# Patient Record
Sex: Female | Born: 1990 | Race: Black or African American | Hispanic: No | Marital: Single | Smoking: Current every day smoker
Health system: Southern US, Community
[De-identification: ages and names within clinical notes are randomized; demographics above are authoritative.]

## PROBLEM LIST (undated history)

## (undated) DIAGNOSIS — J45909 Unspecified asthma, uncomplicated: Secondary | ICD-10-CM

## (undated) DIAGNOSIS — Z9189 Other specified personal risk factors, not elsewhere classified: Secondary | ICD-10-CM

## (undated) DIAGNOSIS — F419 Anxiety disorder, unspecified: Secondary | ICD-10-CM

## (undated) DIAGNOSIS — D649 Anemia, unspecified: Secondary | ICD-10-CM

## (undated) DIAGNOSIS — F32A Depression, unspecified: Secondary | ICD-10-CM

## (undated) DIAGNOSIS — R011 Cardiac murmur, unspecified: Secondary | ICD-10-CM

## (undated) HISTORY — DX: Depression, unspecified: F32.A

## (undated) HISTORY — DX: Cardiac murmur, unspecified: R01.1

## (undated) HISTORY — PX: APPENDECTOMY: SHX54

## (undated) HISTORY — DX: Anxiety disorder, unspecified: F41.9

## (undated) HISTORY — DX: Other specified personal risk factors, not elsewhere classified: Z91.89

## (undated) HISTORY — DX: Unspecified asthma, uncomplicated: J45.909

## (undated) HISTORY — PX: COLON SURGERY: SHX602

---

## 2011-12-07 ENCOUNTER — Ambulatory Visit: Payer: Self-pay | Admitting: Cardiovascular Disease

## 2013-04-16 ENCOUNTER — Emergency Department: Payer: Self-pay | Admitting: Emergency Medicine

## 2013-04-16 LAB — COMPREHENSIVE METABOLIC PANEL
BUN: 8 mg/dL (ref 7–18)
Bilirubin,Total: 0.3 mg/dL (ref 0.2–1.0)
Chloride: 105 mmol/L (ref 98–107)
Creatinine: 0.82 mg/dL (ref 0.60–1.30)
EGFR (African American): >60
EGFR (Non-African Amer.): >60
Glucose: 86 mg/dL (ref 65–99)
Osmolality: 270 (ref 275–301)
Potassium: 3.6 mmol/L (ref 3.5–5.1)
Sodium: 136 mmol/L (ref 136–145)
Total Protein: 8.5 g/dL — ABNORMAL HIGH (ref 6.4–8.2)

## 2013-04-16 LAB — CBC WITH DIFFERENTIAL/PLATELET
Basophil %: 0.5 %
Eosinophil #: 0.1 10*3/uL (ref 0.0–0.7)
Eosinophil %: 1.3 %
Lymphocyte %: 18.3 %
MCH: 29 pg (ref 26.0–34.0)
MCHC: 32.4 g/dL (ref 32.0–36.0)
Monocyte #: 0.7 x10 3/mm (ref 0.2–0.9)
Monocyte %: 7.7 %
Neutrophil #: 6.5 10*3/uL (ref 1.4–6.5)
RDW: 12.7 % (ref 11.5–14.5)
WBC: 9 10*3/uL (ref 3.6–11.0)

## 2013-04-16 LAB — URINALYSIS, COMPLETE
Bilirubin,UR: NEGATIVE
Glucose,UR: NEGATIVE mg/dL (ref 0–75)
Nitrite: NEGATIVE
Ph: 5 (ref 4.5–8.0)
Protein: 100
Specific Gravity: 1.029 (ref 1.003–1.030)
WBC UR: 47 /HPF (ref 0–5)

## 2013-04-16 LAB — PREGNANCY, URINE: Pregnancy Test, Urine: NEGATIVE m[IU]/mL

## 2013-04-21 ENCOUNTER — Emergency Department: Payer: Self-pay | Admitting: Emergency Medicine

## 2013-04-21 LAB — URINALYSIS, COMPLETE
Bacteria: NONE SEEN
Bilirubin,UR: NEGATIVE
Glucose,UR: NEGATIVE mg/dL (ref 0–75)
Ketone: NEGATIVE
Leukocyte Esterase: NEGATIVE
Nitrite: NEGATIVE
Squamous Epithelial: 1

## 2013-04-21 LAB — CBC
HGB: 12.1 g/dL (ref 12.0–16.0)
MCV: 90 fL (ref 80–100)
Platelet: 352 10*3/uL (ref 150–440)
RBC: 4.15 10*6/uL (ref 3.80–5.20)
RDW: 12.7 % (ref 11.5–14.5)
WBC: 9.2 10*3/uL (ref 3.6–11.0)

## 2013-04-21 LAB — BASIC METABOLIC PANEL
Anion Gap: 5 — ABNORMAL LOW (ref 7–16)
BUN: 6 mg/dL — ABNORMAL LOW (ref 7–18)
Calcium, Total: 8.9 mg/dL (ref 8.5–10.1)
Chloride: 103 mmol/L (ref 98–107)
Co2: 29 mmol/L (ref 21–32)
EGFR (African American): >60
Glucose: 90 mg/dL (ref 65–99)
Sodium: 137 mmol/L (ref 136–145)

## 2013-04-21 LAB — PREGNANCY, URINE: Pregnancy Test, Urine: NEGATIVE m[IU]/mL

## 2013-04-21 LAB — WET PREP, GENITAL

## 2013-04-24 ENCOUNTER — Ambulatory Visit: Payer: Self-pay | Admitting: Unknown Physician Specialty

## 2013-04-24 ENCOUNTER — Inpatient Hospital Stay: Payer: Self-pay | Admitting: Surgery

## 2013-04-24 LAB — CBC WITH DIFFERENTIAL/PLATELET
Basophil #: 0.1 10*3/uL (ref 0.0–0.1)
Basophil %: 0.5 %
HCT: 37.9 % (ref 35.0–47.0)
HGB: 12.6 g/dL (ref 12.0–16.0)
Lymphocyte #: 1.8 10*3/uL (ref 1.0–3.6)
Lymphocyte %: 14 %
MCH: 29.5 pg (ref 26.0–34.0)
MCHC: 33.2 g/dL (ref 32.0–36.0)
Monocyte #: 0.5 x10 3/mm (ref 0.2–0.9)
Neutrophil #: 10.2 10*3/uL — ABNORMAL HIGH (ref 1.4–6.5)
Neutrophil %: 79.8 %
Platelet: 411 10*3/uL (ref 150–440)
RBC: 4.26 10*6/uL (ref 3.80–5.20)
RDW: 12.7 % (ref 11.5–14.5)

## 2013-04-24 LAB — COMPREHENSIVE METABOLIC PANEL
Albumin: 3.6 g/dL (ref 3.4–5.0)
Alkaline Phosphatase: 66 U/L
Anion Gap: 3 — ABNORMAL LOW (ref 7–16)
BUN: 6 mg/dL — ABNORMAL LOW (ref 7–18)
Bilirubin,Total: 0.4 mg/dL (ref 0.2–1.0)
Calcium, Total: 9.9 mg/dL (ref 8.5–10.1)
Chloride: 97 mmol/L — ABNORMAL LOW (ref 98–107)
Co2: 30 mmol/L (ref 21–32)
Creatinine: 0.65 mg/dL (ref 0.60–1.30)
EGFR (African American): >60
EGFR (Non-African Amer.): >60
Osmolality: 257 (ref 275–301)
Potassium: 3.5 mmol/L (ref 3.5–5.1)
SGOT(AST): 22 U/L (ref 15–37)
Total Protein: 9 g/dL — ABNORMAL HIGH (ref 6.4–8.2)

## 2013-04-24 LAB — URINALYSIS, COMPLETE
Bacteria: NONE SEEN
Bilirubin,UR: NEGATIVE
Glucose,UR: NEGATIVE mg/dL (ref 0–75)
Leukocyte Esterase: NEGATIVE
Nitrite: NEGATIVE
Ph: 7 (ref 4.5–8.0)
Specific Gravity: 1.056 (ref 1.003–1.030)

## 2013-04-24 LAB — HCG, QUANTITATIVE, PREGNANCY: Beta Hcg, Quant.: <1 m[IU]/mL — ABNORMAL LOW

## 2013-04-24 LAB — LIPASE, BLOOD: Lipase: 239 U/L (ref 73–393)

## 2013-04-25 LAB — COMPREHENSIVE METABOLIC PANEL
Alkaline Phosphatase: 53 U/L
Anion Gap: 5 — ABNORMAL LOW (ref 7–16)
BUN: 6 mg/dL — ABNORMAL LOW (ref 7–18)
Calcium, Total: 9.1 mg/dL (ref 8.5–10.1)
Chloride: 98 mmol/L (ref 98–107)
Creatinine: 0.71 mg/dL (ref 0.60–1.30)
EGFR (African American): >60
EGFR (Non-African Amer.): >60
Glucose: 86 mg/dL (ref 65–99)
Potassium: 3.7 mmol/L (ref 3.5–5.1)
SGPT (ALT): 11 U/L — ABNORMAL LOW (ref 12–78)

## 2013-04-25 LAB — CBC WITH DIFFERENTIAL/PLATELET
Basophil #: 0 10*3/uL (ref 0.0–0.1)
Basophil %: 0.4 %
Eosinophil %: 1.1 %
HGB: 10.8 g/dL — ABNORMAL LOW (ref 12.0–16.0)
Lymphocyte #: 1.7 10*3/uL (ref 1.0–3.6)
Lymphocyte %: 16.5 %
MCH: 29.4 pg (ref 26.0–34.0)
MCHC: 33.1 g/dL (ref 32.0–36.0)
Monocyte #: 0.7 x10 3/mm (ref 0.2–0.9)
Monocyte %: 6.5 %
Neutrophil %: 75.5 %
Platelet: 340 10*3/uL (ref 150–440)
RBC: 3.69 10*6/uL — ABNORMAL LOW (ref 3.80–5.20)
WBC: 10.2 10*3/uL (ref 3.6–11.0)

## 2013-04-26 LAB — BASIC METABOLIC PANEL
Anion Gap: 7 (ref 7–16)
BUN: 5 mg/dL — ABNORMAL LOW (ref 7–18)
Calcium, Total: 8.7 mg/dL (ref 8.5–10.1)
Chloride: 100 mmol/L (ref 98–107)
Co2: 29 mmol/L (ref 21–32)
Creatinine: 0.76 mg/dL (ref 0.60–1.30)
EGFR (African American): >60
EGFR (Non-African Amer.): >60
Glucose: 84 mg/dL (ref 65–99)
Osmolality: 268 (ref 275–301)
Potassium: 3.9 mmol/L (ref 3.5–5.1)
Sodium: 136 mmol/L (ref 136–145)

## 2013-04-26 LAB — CBC WITH DIFFERENTIAL/PLATELET
Basophil #: 0 10*3/uL (ref 0.0–0.1)
Basophil %: 0.5 %
Eosinophil #: 0.2 10*3/uL (ref 0.0–0.7)
Eosinophil %: 1.8 %
HCT: 31 % — ABNORMAL LOW (ref 35.0–47.0)
HGB: 10.2 g/dL — ABNORMAL LOW (ref 12.0–16.0)
Lymphocyte #: 1.3 10*3/uL (ref 1.0–3.6)
Lymphocyte %: 14.9 %
MCH: 28.9 pg (ref 26.0–34.0)
MCHC: 32.9 g/dL (ref 32.0–36.0)
MCV: 88 fL (ref 80–100)
Monocyte #: 0.6 x10 3/mm (ref 0.2–0.9)
Monocyte %: 7.3 %
Neutrophil #: 6.7 10*3/uL — ABNORMAL HIGH (ref 1.4–6.5)
Neutrophil %: 75.5 %
Platelet: 336 10*3/uL (ref 150–440)
RBC: 3.52 10*6/uL — ABNORMAL LOW (ref 3.80–5.20)
RDW: 12.6 % (ref 11.5–14.5)
WBC: 8.9 10*3/uL (ref 3.6–11.0)

## 2013-04-30 LAB — PATHOLOGY REPORT

## 2013-05-01 LAB — CBC WITH DIFFERENTIAL/PLATELET
BASOS PCT: 0.9 %
Basophil #: 0.1 10*3/uL (ref 0.0–0.1)
EOS ABS: 0.3 10*3/uL (ref 0.0–0.7)
Eosinophil %: 3.2 %
HCT: 26.7 % — ABNORMAL LOW (ref 35.0–47.0)
HGB: 8.9 g/dL — ABNORMAL LOW (ref 12.0–16.0)
LYMPHS ABS: 1.8 10*3/uL (ref 1.0–3.6)
Lymphocyte %: 20.2 %
MCH: 29.3 pg (ref 26.0–34.0)
MCHC: 33.3 g/dL (ref 32.0–36.0)
MCV: 88 fL (ref 80–100)
MONO ABS: 0.7 x10 3/mm (ref 0.2–0.9)
MONOS PCT: 7.6 %
NEUTROS PCT: 68.1 %
Neutrophil #: 6.2 10*3/uL (ref 1.4–6.5)
Platelet: 330 10*3/uL (ref 150–440)
RBC: 3.04 10*6/uL — ABNORMAL LOW (ref 3.80–5.20)
RDW: 12.9 % (ref 11.5–14.5)
WBC: 9.1 10*3/uL (ref 3.6–11.0)

## 2014-03-04 IMAGING — US US PELV - US TRANSVAGINAL
1 series · 14 of 25 positions shown · non-contrast
Comparison: None

CLINICAL DATA: Pelvic pain

EXAM:
TRANSABDOMINAL AND TRANSVAGINAL ULTRASOUND OF PELVIS
TECHNIQUE: Study was performed transabdominally to optimize pelvic field of
view evaluation and transvaginally to optimize internal visceral
architecture evaluation.

[Series 1: us pelv - us transvaginal · 0.20mm/px · 14 of 70 slices shown]
[im 1/70]
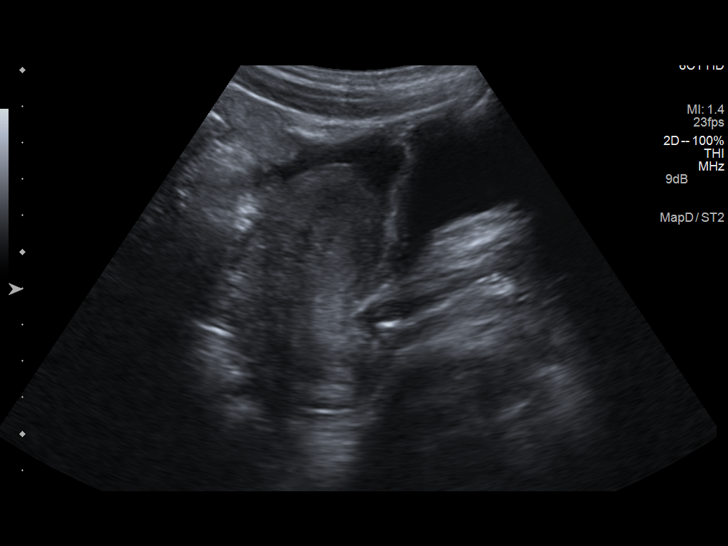
[im 6/70]
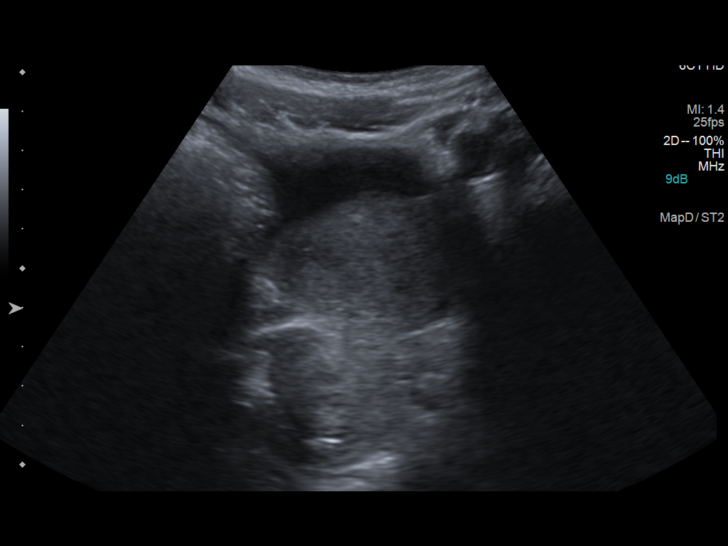
[im 12/70]
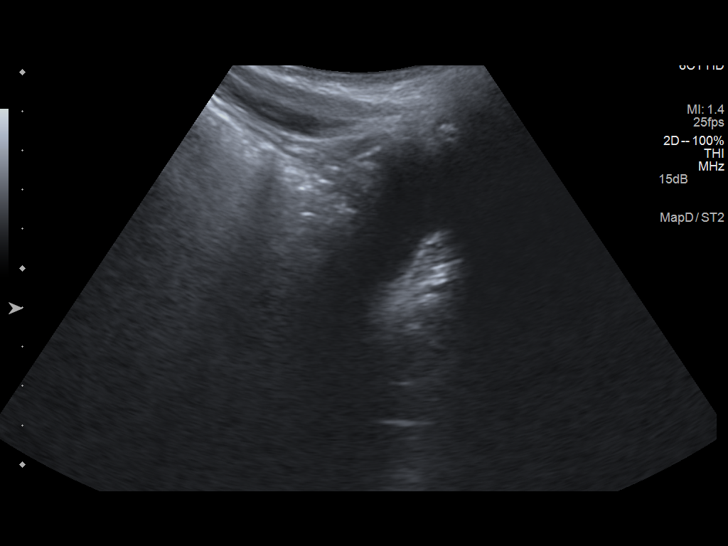
[im 18/70]
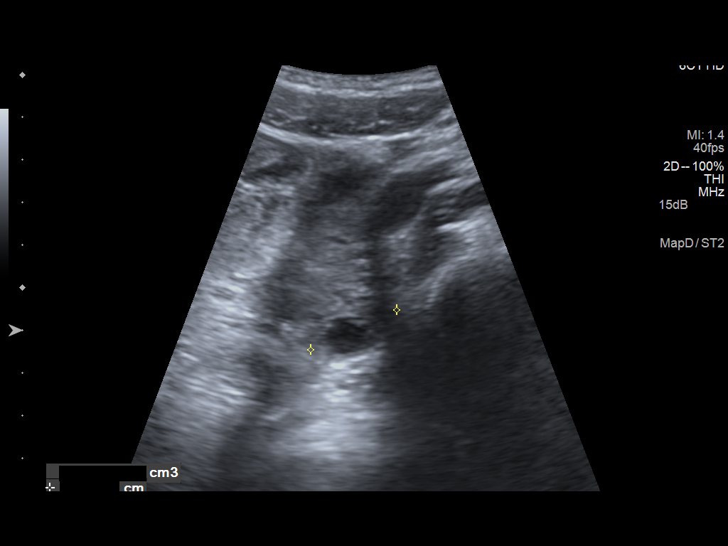
[im 24/70]
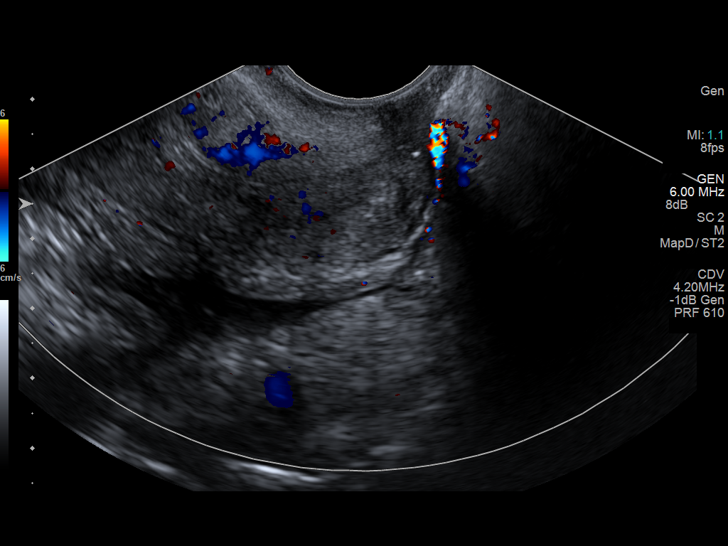
[im 26/70]
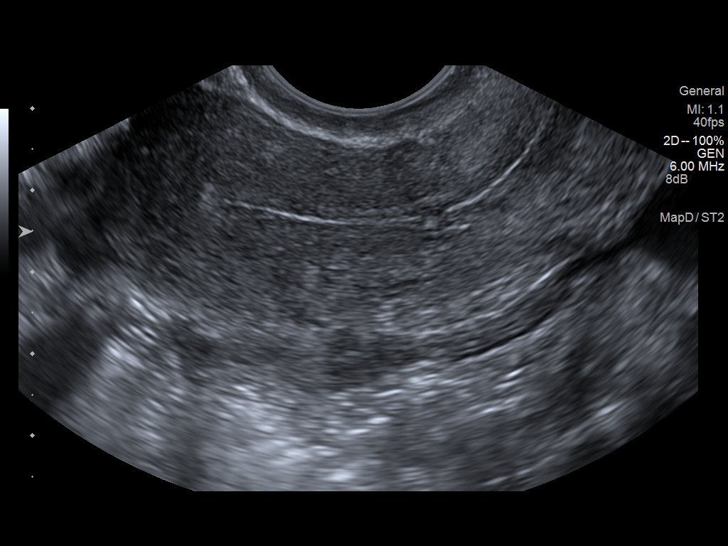
[im 32/70]
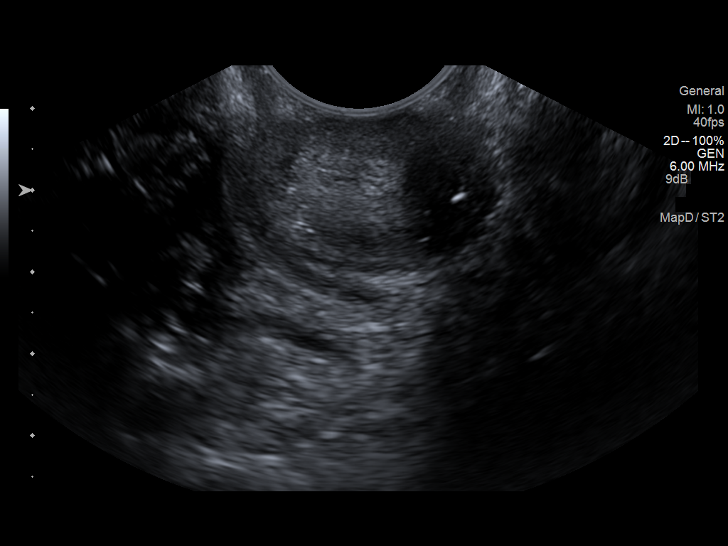
[im 38/70]
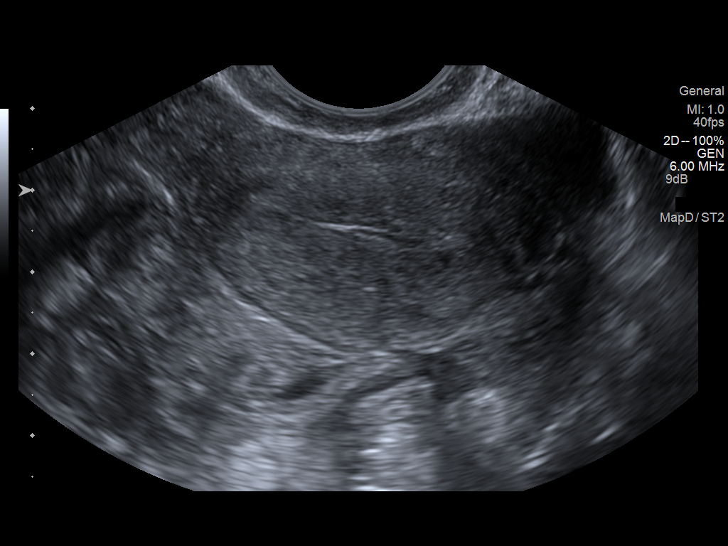
[im 44/70]
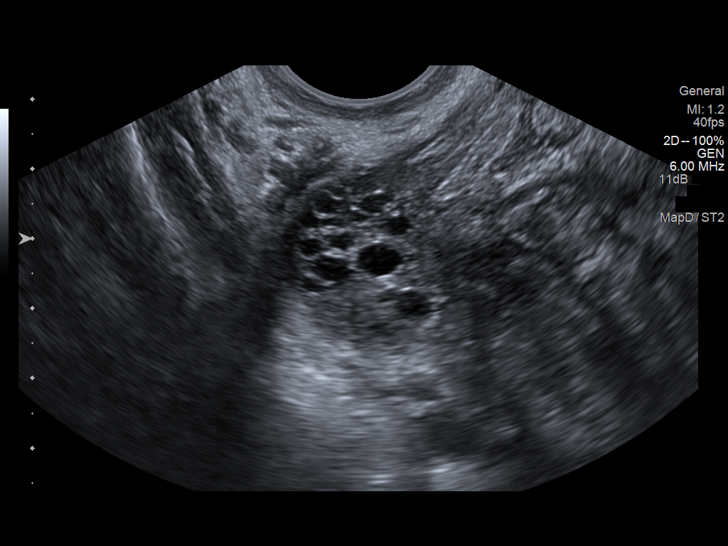
[im 47/70]
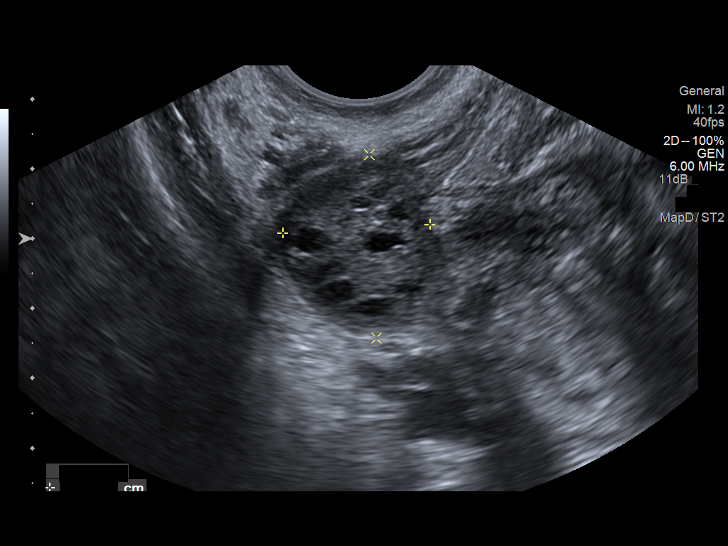
[im 52/70]
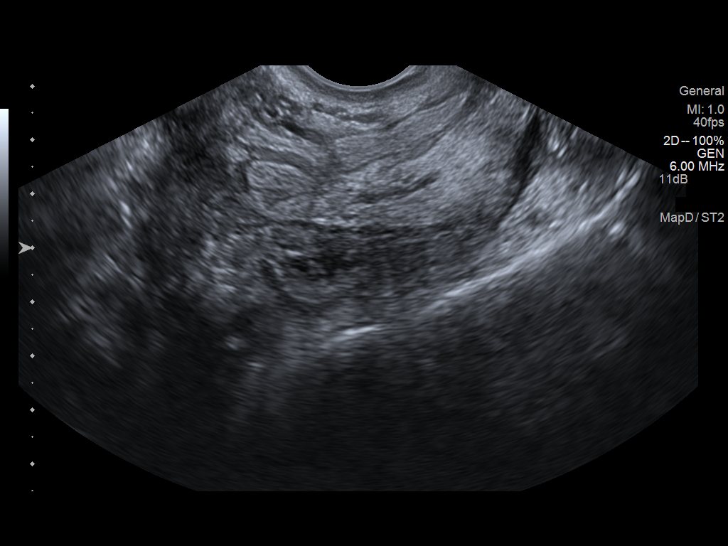
[im 58/70]
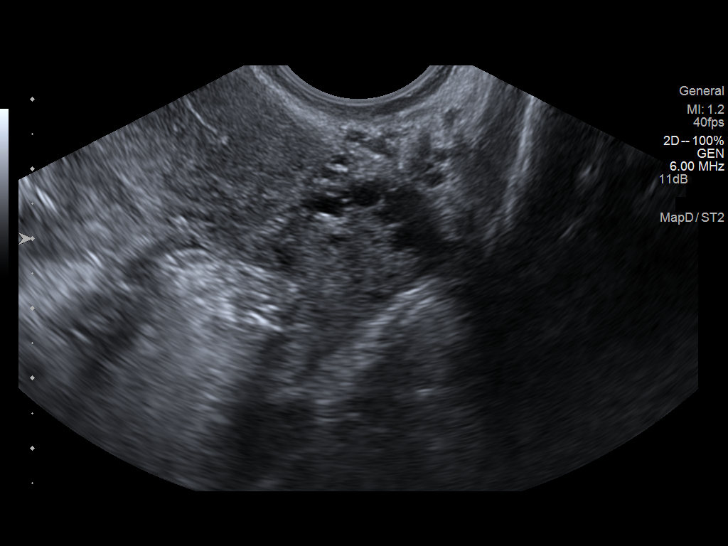
[im 64/70]
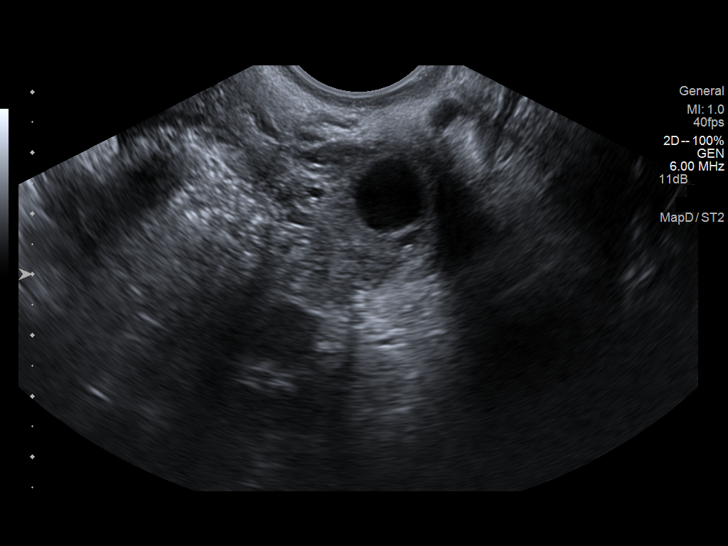
[im 70/70]
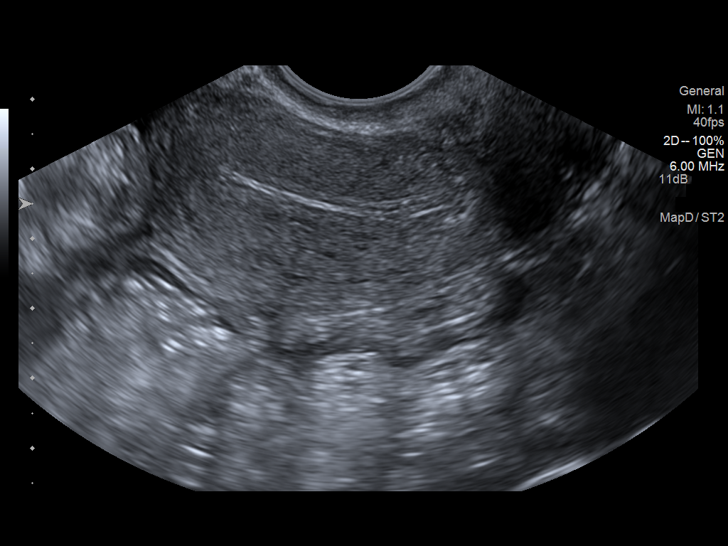

[14 of 25 positions shown; findings below may reference images not displayed]

FINDINGS: Uterus measures 7.1 x 2.4 x 4.2 cm in size. Uterus is anteverted.
There is no intrauterine mass. Endometrium measures 2 mm in
thickness with a smooth contour. There is trace fluid in the
endometrium.

Right ovary measures 2.1 x 2.6 x 2.4 cm. Left ovary measures 2.0 x
2.4 x 2.8 cm. There is no extrauterine pelvic or adnexal mass. There
is a small amount of free fluid in the cul-de-sac.
IMPRESSION: Small amount of free fluid in cul-de-sac. Question recent ovarian
cyst rupture. No ovarian mass seen.

Trace fluid in the endometrium is of questionable significance.
Uterus otherwise appears normal.

## 2014-03-09 IMAGING — CR DG ABDOMEN 2V
1 series · 2 of 2 positions shown · non-contrast
Comparison: None.

CLINICAL DATA: Abdominal pain

EXAM:
ABDOMEN - 2 VIEW

[Series 1: erect ap · 0.17mm/px · 2 of 2 slices shown]
[im 1/2]
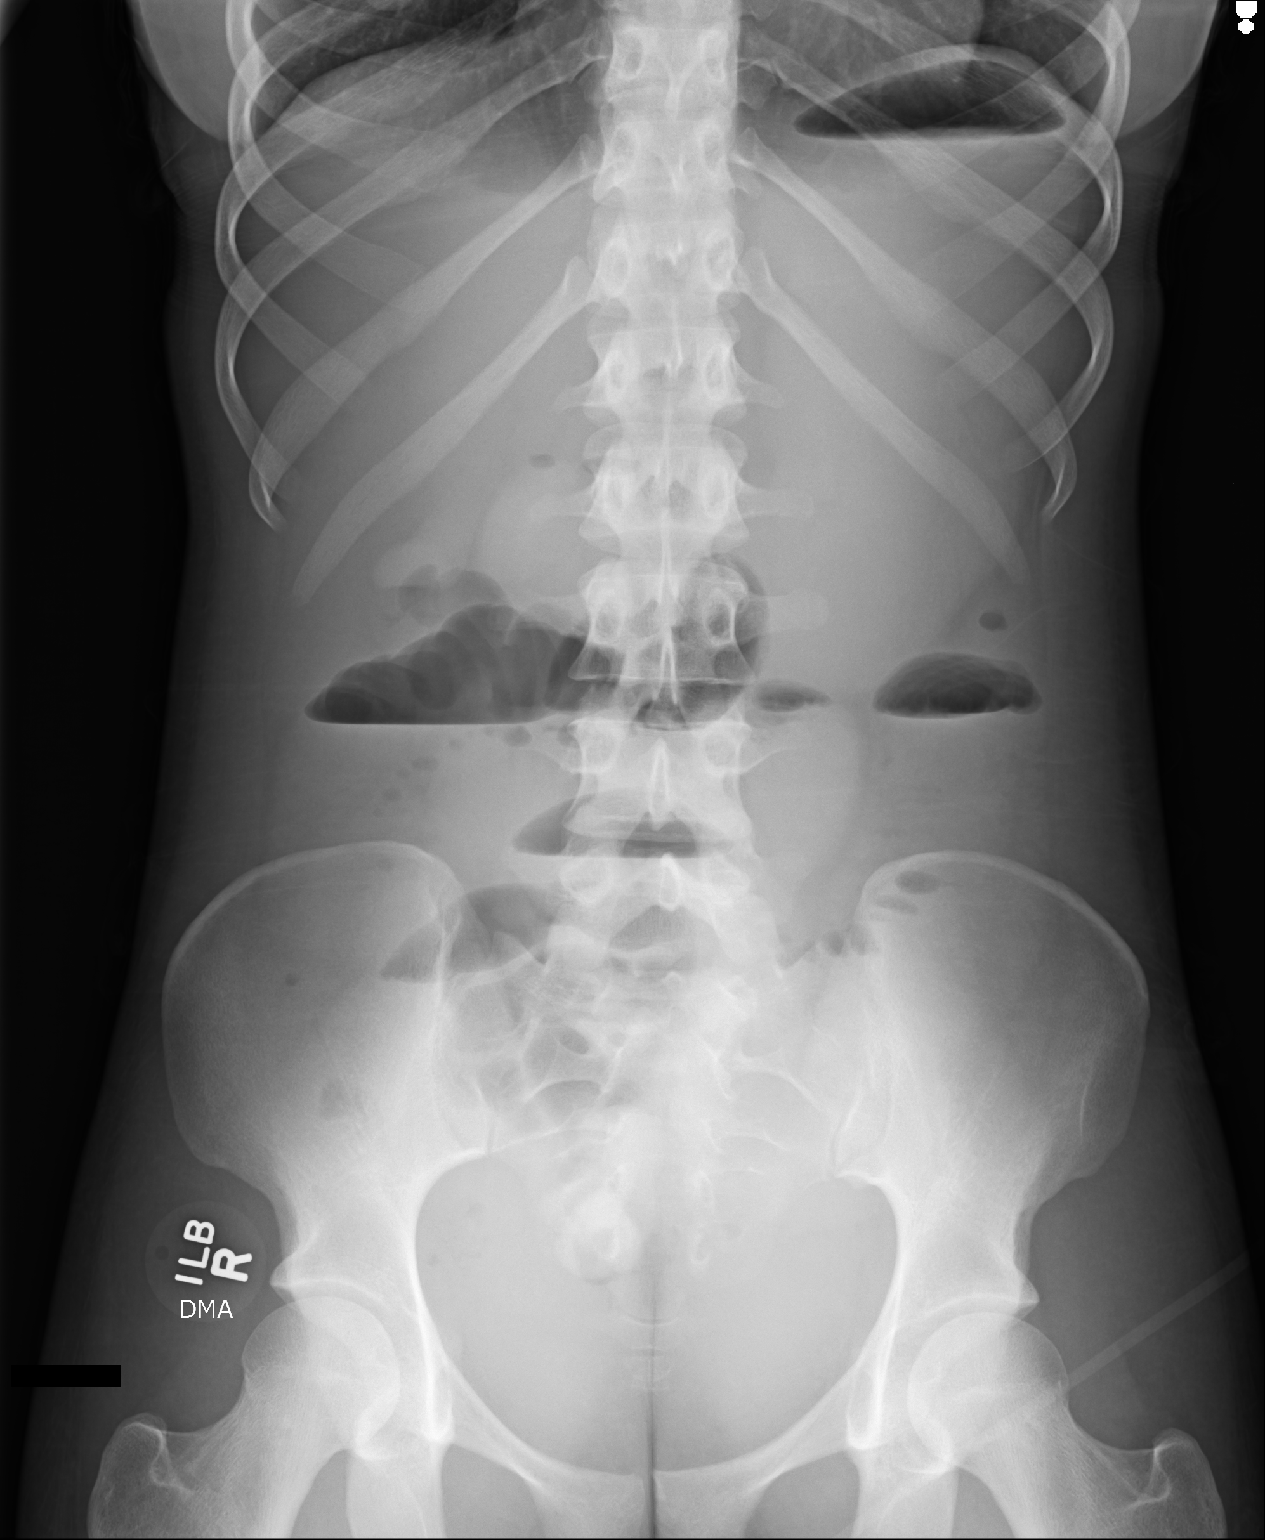
[im 2/2]
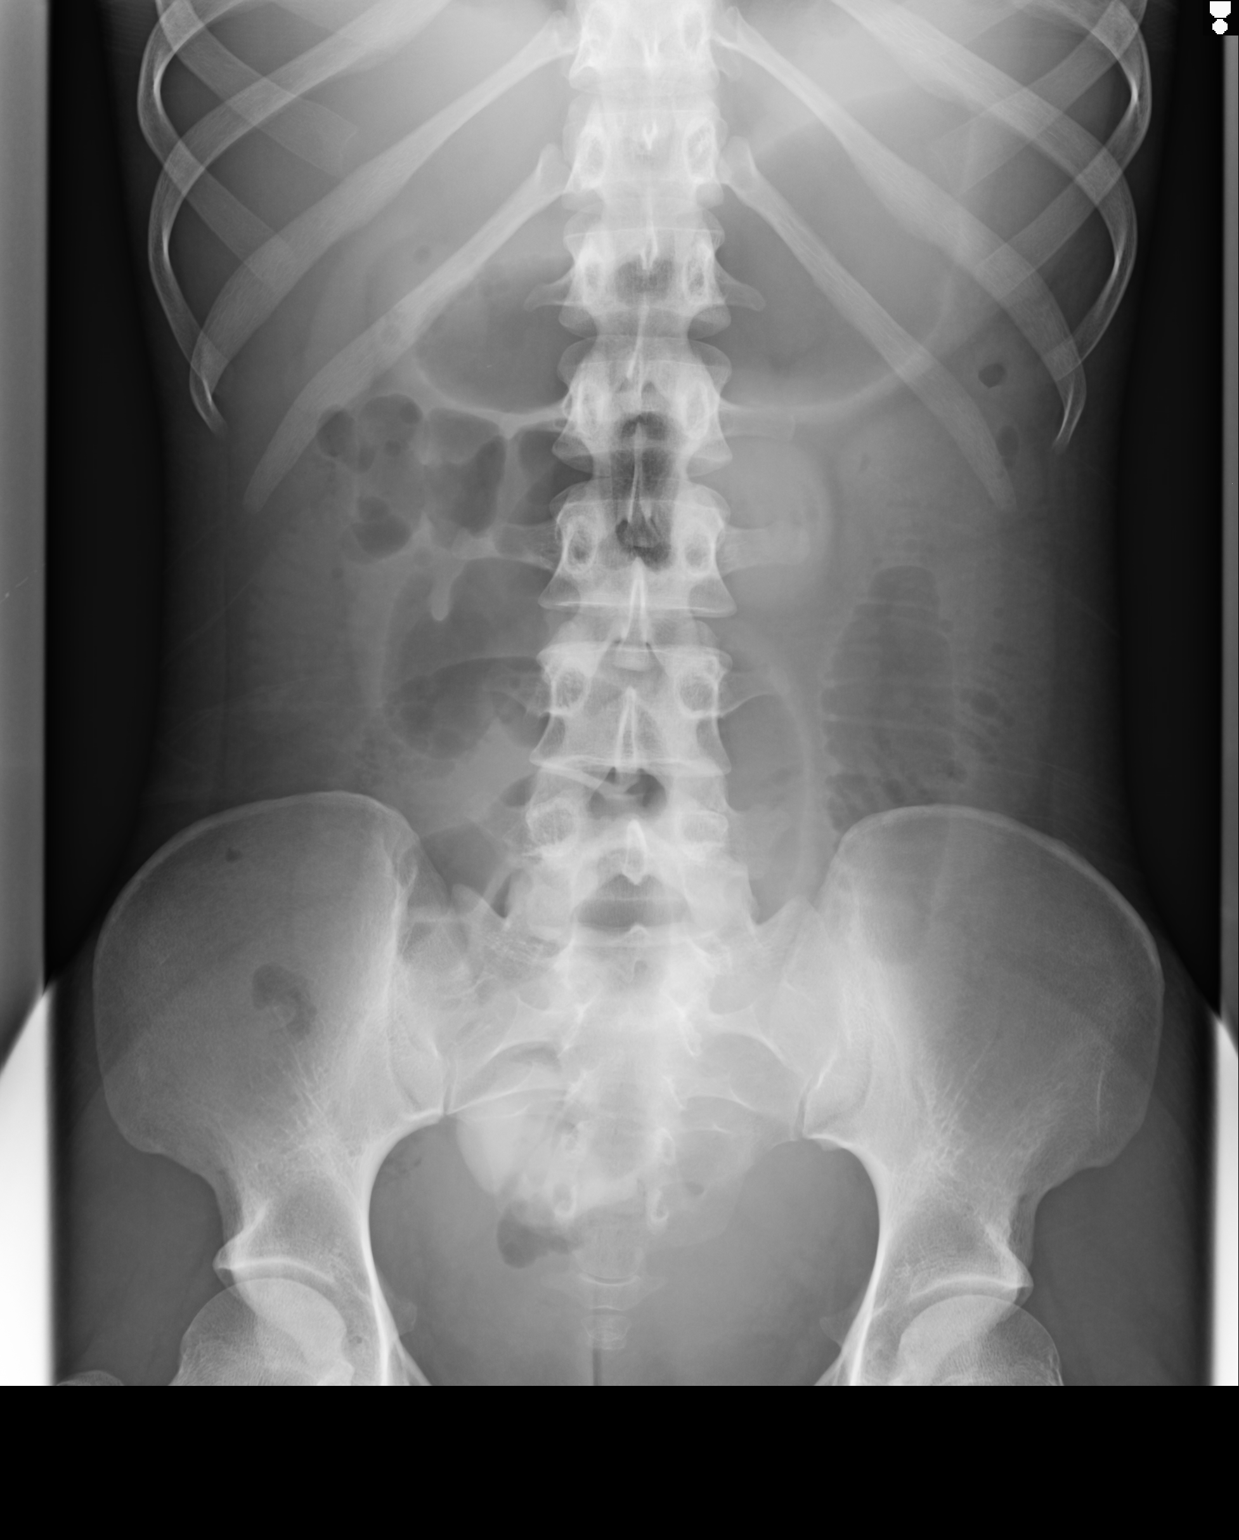

[2 of 2 positions shown; findings below may reference images not displayed]

FINDINGS: There is small bowel dilatation with multiple small bowel air-fluid
levels. There is a relative paucity of colonic air. There is no
pneumoperitoneum, pneumatosis or portal venous gas. There are no
pathologic calcifications. The osseous structures are unremarkable.
IMPRESSION: Persistent small bowel dilatation with multiple air-fluid levels
most concerning for small bowel obstruction.

## 2014-08-16 NOTE — H&P (Signed)
PATIENT NAME:  Brianna KindlerFOWLER, [name] DATE OF BIRTH:  Jan 04, 1991  DATE OF ADMISSION:  04/24/2013  REASON FOR ADMISSION: Right lower quadrant pain, leukocytosis and abdominal inflammation with possible intra-abdominal volvulus.   HISTORY OF PRESENT ILLNESS: [name] is a pleasant 24 year old female who over the last week, has been having recurrent sharp pains in her right side. She says that it began suddenly when they occur. They usually resolve a bit and they are worse with movement. She also has had some subjective chills. No nausea and vomiting and is hungry, but does have a significant feeling of being distended. She has been to urgent care multiple times this week in the ER and has been told she has colitis versus urinary tract infection versus most recent pelvic inflammatory disease as her gonococcal PCR  came back positive. No headache, fevers, chest pain, shortness of breath, cough.  She denies vomiting, diarrhea, constipation, dysuria or hematuria.   HOME MEDICATIONS: Macrodantin one cap b.i.d. which she has not been taking, OCP 1/20 p.o. daily.   ALLERGIES: No known drug allergies.   PAST MEDICAL HISTORY:  History of heart murmur and asthma.   FAMILY HISTORY: She has family history of Hodgkin's, hypertension. No history of heart disease, diabetes.   REVIEW OF SYSTEMS: A 12-point review of systems is obtained. Pertinent positive and negatives as above.    PHYSICAL EXAMINATION: VITAL SIGNS: Temperature 97.8, pulse 85, blood pressure 120/78, respirations 14.  GENERAL: No acute distress. Alert and oriented x 3.  HEAD: Normocephalic, atraumatic.  EYES: No scleral icterus. No conjunctivitis.  FACE: No obvious facial trauma. Normal external nose. Normal external ears.  ABDOMEN: Soft, mildly tender in right lower quadrant, moderate distention.  EXTREMITIES: Moves all extremities well. Strength 5/5.   NEUROLOGIC:  Cranial nerves II through XII grossly intact.   LABORATORY DATA:  Currently: White cell count 12.8 which is up from 9.2, 80% neutrophils, sodium is 130, chloride is 97, creatinine is 0.65.   CT scan shows mild pelvic fluid, prominent in the proximal and mid small bowel distention with tapering in the mid-right abdomen with what looks to me to be a twist in the mesentery and no contrast past that point.    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 We will admit for antibiotics for pelvic inflammatory disease and follow-up x-ray in the abdomen to ensure contrast has progressed to colon.  If does not progress, becomes worse or leukocytosis increases, we will discuss possible diagnostic laparoscopy in concurrence with OB/GYN and their recs.    ____________________________ Si Raiderhristopher A. Satya Bohall, MD cal:cc D: 04/24/2013 17:35:17 ET T: 04/24/2013 18:53:34 ET JOB#: [id number]  cc: Cristal Deerhristopher A. Delanna Blacketer, MD, <Dictator> Jarvis NewcomerHRISTOPHER A Coen Miyasato MD ELECTRONICALLY SIGNED 04/25/2013 16:10

## 2014-08-17 NOTE — Op Note (Signed)
PATIENT NAME:  [name], [name] MR#:  [id number] DATE OF BIRTH:  March 31, 1991  DATE OF PROCEDURE:  04/26/2013  PREOPERATIVE DIAGNOSIS:  Small bowel obstruction.  POSTOPERATIVE DIAGNOSIS:  Small bowel obstruction.  PROCEDURE: 1.  Laparoscopy.  2.  Laparoscopic appendectomy.  3.  Mini laparotomy. 4.  Lysis of adhesions.  ANESTHESIA:  General.  SURGEON:  Quentin Orealph L. Ely, III, MD  OPERATIVE PROCEDURE:  With the patient in the supine position, after the induction of appropriate general anesthesia, the patient's abdomen was prepped with ChloraPrep and draped with sterile towels. The patient was placed in the head down, feet up position. A small infraumbilical incision was made in the standard fashion and carried down bluntly through the subcutaneous tissue. A Veress needle was used to cannulate the peritoneal cavity. CO2 was insufflated to appropriate pressure measurements. When approximately 2.5 L of CO2 were instilled, the Veress needle was withdrawn, an 11-mm [employer] port was inserted into the peritoneal cavity. Intraperitoneal position was confirmed, and CO2 was reinsufflated. The right upper quadrant transverse incision and an 11-mm port inserted under direct vision. The pelvis was examined. There was a large amount of free fluid that was not frankly infected. The appendix was identified. It was thickened, injected but did not appear to have a primary appendicitis but a reactive process. Both tubes and ovaries were examined. While they were both thickened, there is no obvious purulence or salpingitis noted. The appendix was abnormal enough that I recommended removal. The mesoappendix was divided with a single application of Endo GIA stapling device carrying a white load. The base of the appendix was followed back to the cecum and amputated with a single application of the Endo GIA stapler carrying a blue load. The appendix was captured in an Endo Catch apparatus and removed. Because there was a  significant bowel size mismatch, particularly proximally and distally, I elected to perform a mini laparotomy. The midline incision was extended slightly above and slightly below the umbilicus and carried down through the subcutaneous tissue with Bovie electrocautery. The midline fascia was identified and opened the length of the skin incision as was the peritoneum. The bowel was elevated into the wound. The very large, dilated small bowel was followed distally and there was an obvious band adhesion across the mesentery into the pelvis and appeared to attach to the pelvic sidewall beside the right tube. This was lysed and the bowel was allowed to expand up out of the pelvis. The bowel was then followed to the ileocecal valve. No other abnormalities were identified. The bowel contents were returned to their anatomic positions. The midline fascia was closed with inverted figure-of-eight sutures of 0 Maxon. Then 0 Vicryl was used to close the fascia in the left lower quadrant and in the right upper quadrant. The skin was closed with 4-0 Monocryl subcuticularly. Steri-Strips applied. Dermabond was applied to the incisions and they were sterilely dressed. The patient was returned to the Recovery Room having tolerated the procedure well. Sponge and instrument counts were correct x 2 in the Operating Room.  ____________________________ Quentin Orealph L. Ely III, MD rle:jm D: 04/26/2013 12:49:19 ET T: 04/26/2013 13:22:17 ET JOB#: [id number]  cc: Quentin Orealph L. Ely III, MD, <Dictator> Jacques EarthlyAnika S. Valentino Saxonherry, MD Quentin OreALPH L ELY MD ELECTRONICALLY SIGNED 04/26/2013 16:20

## 2014-08-17 NOTE — Discharge Summary (Signed)
PATIENT NAME:  Brianna KindlerFOWLER, Jolanda MR#:  [id number] DATE OF BIRTH:  [birth year]  DATE OF ADMISSION: 04/24/2013  DATE OF DISCHARGE: 05/01/2013   DATE OF SURGERY:  04/26/2013  BRIEF HISTORY: Brianna KindlerDerika Carie is a 24 year old woman seen in the Emergency Room with a several day history of abdominal pain. She has been having recurrent sharp pains in her right lower quadrant with mild discomfort with moving. She presented to the Emergency Room on 2 separate occasions prior to evaluation. She was told she had colitis versus urinary tract versus possible pelvic inflammatory disease.  Her gonococcal PCR came back positive during one of her evaluations.  On her most recent one, her Chlamydia test came back positive in addition. She was placed on antibiotic therapy, which she did not take. She presented back to the Emergency Room for further evaluation. CT scan was performed which demonstrated what appeared to be distal small bowel obstruction with mesentery obstruction. She was admitted to surgical service,  She continued to have mild temperature with significant abdominal discomfort. She was nauseated but did not vomit. Abdomen examination was significant for pain, rebound and mild distention. Films were not better. Suggested a possible  small bowel obstruction. She was taken to surgery on 01/01 where she underwent a laparoscopy and then minilaparotomy. Her appendix was obviously involved in the process but did not appear to be the primary source of the infection. The appendix was removed laparoscopically and then the surgery expanded to minilaparotomy.  There appeared to be a band adhesion to the right pelvic side wall from the mesentery causing a partial obstruction. Presumed etiology was her pelvic inflammatory disease. The adhesion was lysed, bowel was run, the area was irrigated and closed. She had slow return of bowel function, but is currently tolerating a diet without difficulty. The wounds look good. There is no  evidence of any infection.   DISCHARGE MEDICATIONS: Include Percocet 5/325 every 6 hours p.r.n. and her birth control pills.  FINAL DISCHARGE DIAGNOSIS:  1.  Small bowel obstruction surgery. 2.  Minilaparotomy. 3.  Lysis of adhesions. 4.  Appendectomy.     ____________________________ Quentin Orealph L. Ely III, MD rle:dp D: 05/01/2013 06:30:35 ET T: 05/01/2013 07:18:00 ET JOB#: [id number]  cc: Quentin Orealph L. Ely III, MD, <Dictator> Jacques EarthlyAnika S. Valentino Saxonherry, MD Quentin OreALPH L ELY MD ELECTRONICALLY SIGNED 05/01/2013 20:37

## 2018-11-03 ENCOUNTER — Encounter: Payer: Self-pay | Admitting: Physician Assistant

## 2018-11-03 ENCOUNTER — Other Ambulatory Visit: Payer: Self-pay

## 2018-11-03 ENCOUNTER — Ambulatory Visit: Payer: Self-pay

## 2018-11-03 ENCOUNTER — Ambulatory Visit (LOCAL_COMMUNITY_HEALTH_CENTER): Payer: Self-pay | Admitting: Physician Assistant

## 2018-11-03 VITALS — BP 106/68 | Ht 68.0 in | Wt 118.0 lb

## 2018-11-03 DIAGNOSIS — Z3009 Encounter for other general counseling and advice on contraception: Secondary | ICD-10-CM

## 2018-11-03 DIAGNOSIS — Z30011 Encounter for initial prescription of contraceptive pills: Secondary | ICD-10-CM

## 2018-11-03 DIAGNOSIS — Z113 Encounter for screening for infections with a predominantly sexual mode of transmission: Secondary | ICD-10-CM

## 2018-11-03 LAB — WET PREP FOR TRICH, YEAST, CLUE
Trichomonas Exam: NEGATIVE
Yeast Exam: NEGATIVE

## 2018-11-03 MED ORDER — NORETHINDRONE ACET-ETHINYL EST 1-20 MG-MCG PO TABS: 1.0000 | ORAL_TABLET | Freq: Every day | ORAL | 0 refills | Status: DC

## 2018-11-03 NOTE — Progress Notes (Signed)
Wet mount reviewed, no tx per standing order. Provider orders completed. 

## 2018-11-03 NOTE — Progress Notes (Signed)
Family Planning Visit- Repeat Yearly Visit  Subjective:  [name] is a 28 y.o. being seen today for an well woman visit and to discuss family planning options.    She is currently using none for pregnancy prevention. Patient reports she does not  want a pregnancy in the next year. Patient has the following medical conditionsdoes not have a problem list on file.  Chief Complaint  Patient presents with  . SEXUALLY TRANSMITTED DISEASE    STD screenings  . Contraception    wants OCP    Patient reports that she has used OCs in the past without problems and desires to restart them.  Declines other methods of BC.  Requests pelvic for STD testing due to new partner and no condoms with last sex.  Declines blood work today.  Patient denies any concerns.   Does the patient desire a pregnancy in the next year? (OKQ flowsheet)  See flowsheet for other program required questions.   Body mass index is 17.94 kg/m. - Patient is eligible for diabetes screening based on BMI and age >97?  not applicable WY6V ordered? not applicable  Patient reports 1 of partners in last year. Desires STI screening?  Yes  Does the patient have a current or past history of drug use? No   No components found for: HCV]   Health Maintenance Due  Topic Date Due  . HIV Screening  02/02/2007  . TETANUS/TDAP  02/02/2011  . PAP-Cervical Cytology Screening  02/01/2013  . PAP SMEAR-Modifier  02/01/2013    Review of Systems  All other systems reviewed and are negative.   The following portions of the patient's history were reviewed and updated as appropriate: allergies, current medications, past family history, past medical history, past social history, past surgical history and problem list. Problem list updated.  Objective:   Vitals:   11/03/18 0955  BP: 106/68  Weight: 118 lb (53.5 kg)  Height: 5\' 8"  (1.727 m)    Physical Exam Vitals signs reviewed.  Constitutional:      General: She is not in acute  distress.    Appearance: Normal appearance.  HENT:     Head: Normocephalic and atraumatic.     Mouth/Throat:     Mouth: Mucous membranes are moist.     Pharynx: Oropharynx is clear. No posterior oropharyngeal erythema.  Neck:     Musculoskeletal: Neck supple. No muscular tenderness.  Pulmonary:     Effort: Pulmonary effort is normal.  Abdominal:     Palpations: Abdomen is soft. There is no mass.     Tenderness: There is no abdominal tenderness. There is no guarding or rebound.  Genitourinary:    General: Normal vulva.     Rectum: Normal.     Comments: External genitalia is normal female without edema, erythema, lesions, nits, lice or inguinal adenopathy Vagina with normal mucosa and discharge, pH=4.5 Cervix without visible lesions Uterus normal size, firm, mobile,nt, no mass, no CMT, no adnexal tenderness or fullness Lymphadenopathy:     Cervical: No cervical adenopathy.  Skin:    General: Skin is warm and dry.     Findings: No bruising, erythema, lesion or rash.  Neurological:     Mental Status: She is alert and oriented to person, place, and time.  Psychiatric:        Mood and Affect: Mood normal.        Behavior: Behavior normal.        Thought Content: Thought content normal.  Judgment: Judgment normal.       Assessment and Plan:  [name] is a [age] y.o. female presenting to the [facility] for an initial well woman exam/family planning visit  Contraception counseling: Reviewed all forms of birth control options available including abstinence; over the counter/barrier methods; hormonal contraceptive medication including pill, patch, ring, injection,contraceptive implant; hormonal and nonhormonal IUDs; permanent sterilization options including vasectomy and the various tubal sterilization modalities. Risks and benefits reviewed.  Questions were answered.  Written information was also given to the patient to review.  Patient desires OCs, this  was prescribed for patient. She will follow up in  3-6 months for surveillance.  She was told to call with any further questions, or with any concerns about this method of contraception.  Emphasized use of condoms 100% of the time for STI prevention.  1. Encounter for counseling regarding contraception Counseled/reviewed with patient risks, benefits, and SE of OCs and how to use correctly. OK to start Microgestin 1/20 28d #6 1 po QD either today or at menses onset per patient preference Rec condoms with all sex for 2 weeks of first cycle and enc always for STD protection Counseled that she should receive a letter or call re:  Pap result in 2-3 weeks.  - WET PREP FOR TRICH, YEAST, CLUE - Chlamydia/Gonorrhea [facility] - IGP, rfx Aptima HPV ASCU - norethindrone-ethinyl estradiol (MICROGESTIN) 1-20 MG-MCG tablet; Take 1 tablet by mouth daily.  Dispense: 6 Package; Refill: 0  2. Screening for STD (sexually transmitted disease) Counseled that RN will call if patient needs to RTC for any treatment once results are back Patient is without symptoms today. - WET PREP FOR TRICH, YEAST, CLUE - Chlamydia/Gonorrhea [facility]     No follow-ups on file.  No future appointments.  Matt Holmesarla J , PA

## 2018-11-21 LAB — HPV APTIMA: HPV Aptima: POSITIVE — AB

## 2018-11-21 LAB — IGP, RFX APTIMA HPV ASCU: PAP Smear Comment: 0

## 2018-11-24 ENCOUNTER — Telehealth: Payer: Self-pay

## 2018-11-28 NOTE — Telephone Encounter (Signed)
TC with patient.  Discussed abnormal pap results and need for referral for colpo. Patient needs to gather insurance info and will call RN back. Informed patient she can choose which GYN provider she would like for appt. Aileen Fass, RN

## 2018-12-29 ENCOUNTER — Telehealth: Payer: Self-pay

## 2018-12-29 NOTE — Telephone Encounter (Signed)
-----   Message from Eduard Clos, RN sent at 12/27/2018 12:15 PM EDT ----- Fredricka Bonine, it looks like you dispensed #6 packs of pills to this patient on 7/10 with the pills expiration date of 01/2019. Can you call the patient and have her return to clinic for pills with a different expiration? You will need to document this phone call.

## 2018-12-29 NOTE — Telephone Encounter (Signed)
Phone call to pt. Explained to pt that OCP she received in July 2020 will expire in Oct 2020. Pt states she can RTC on 01/02/2019, her day off, to return/exchange OCP.  Pt states she will bring all her remaining packs of pills with her.

## 2019-01-02 ENCOUNTER — Ambulatory Visit (LOCAL_COMMUNITY_HEALTH_CENTER): Payer: Self-pay

## 2019-01-02 ENCOUNTER — Telehealth: Payer: Self-pay | Admitting: General Practice

## 2019-01-02 ENCOUNTER — Other Ambulatory Visit: Payer: Self-pay

## 2019-01-02 DIAGNOSIS — Z3009 Encounter for other general counseling and advice on contraception: Secondary | ICD-10-CM

## 2019-01-02 DIAGNOSIS — Z3041 Encounter for surveillance of contraceptive pills: Secondary | ICD-10-CM

## 2019-01-02 MED ORDER — NORETHINDRONE ACET-ETHINYL EST 1-20 MG-MCG PO TABS: 1.0000 | ORAL_TABLET | Freq: Every day | ORAL | 0 refills | Status: DC

## 2019-01-02 MED ORDER — NORETHINDRONE 0.35 MG PO TABS: 1.0000 | ORAL_TABLET | Freq: Every day | ORAL | 0 refills | Status: DC

## 2019-01-02 NOTE — Progress Notes (Signed)
Patient returned Microgestin that would expire prior to completing and was given 4 cycles to use that expire after 01/2019.

## 2019-01-02 NOTE — Addendum Note (Signed)
Addended by: Jenetta Downer on: 01/02/2019 04:55 PM   Modules accepted: Orders

## 2019-01-02 NOTE — Progress Notes (Signed)
Patient came in to clinic today to exchange OC that will expire before she can finish them. Patient returned 4 unopened packs of Micronor Fe, and given 4 packs, to replace those, with appropriate expiration dates. Patient stated she is finishing her second pack of the original 6, so patient given 4 new packs and counseled on how to take.Jenetta Downer, RN

## 2019-01-23 NOTE — Telephone Encounter (Signed)
Sent patient message in my chart. Aileen Fass, RN

## 2019-01-30 NOTE — Telephone Encounter (Signed)
Generic letter mailed to patient attempting contact. Needs colpo. Aileen Fass, RN

## 2019-02-05 ENCOUNTER — Telehealth: Payer: Self-pay

## 2019-02-05 NOTE — Telephone Encounter (Signed)
LM for patient's emergency contact to have patient call [facility] Aileen Fass, RN

## 2019-02-06 NOTE — Telephone Encounter (Signed)
TC from patient.  Reviewed pap results and need for colpo. Patient states she has insurance and would like to go to [facility]. RN will send referral. Instructed patient to call [facility] if she hasn't heard from [facility] within 1-2 weeks. Verbalized understanding Aileen Fass, RN

## 2019-02-07 ENCOUNTER — Telehealth: Payer: Self-pay | Admitting: *Deleted

## 2019-02-13 ENCOUNTER — Telehealth: Payer: Self-pay | Admitting: Obstetrics & Gynecology

## 2019-02-13 NOTE — Telephone Encounter (Signed)
[facility] referring for Needs colpo. Called and spoke with Alyse Low with [facility] at [facility] to assist patient with help for referred appointment. Will contact patient once patient is schedule with [facility]. Called and left voicemail for patient to call back to be schedule

## 2019-03-06 ENCOUNTER — Encounter: Payer: Self-pay | Admitting: *Deleted

## 2019-03-06 ENCOUNTER — Inpatient Hospital Stay (HOSPITAL_COMMUNITY): Admit: 2019-03-06 | Payer: No Typology Code available for payment source

## 2019-03-06 ENCOUNTER — Other Ambulatory Visit
Admission: RE | Admit: 2019-03-06 | Discharge: 2019-03-06 | Disposition: A | Discharge status: Home / Self Care | Payer: No Typology Code available for payment source | Source: Ambulatory Visit | Attending: Obstetrics & Gynecology | Admitting: Obstetrics & Gynecology

## 2019-03-06 ENCOUNTER — Ambulatory Visit (INDEPENDENT_AMBULATORY_CARE_PROVIDER_SITE_OTHER): Payer: No Typology Code available for payment source | Admitting: Obstetrics & Gynecology

## 2019-03-06 ENCOUNTER — Encounter: Payer: Self-pay | Admitting: Obstetrics & Gynecology

## 2019-03-06 ENCOUNTER — Other Ambulatory Visit: Payer: Self-pay

## 2019-03-06 ENCOUNTER — Ambulatory Visit: Payer: No Typology Code available for payment source

## 2019-03-06 VITALS — BP 108/64 | Ht 68.5 in | Wt 124.0 lb

## 2019-03-06 DIAGNOSIS — N870 Mild cervical dysplasia: Secondary | ICD-10-CM

## 2019-03-06 DIAGNOSIS — R87810 Cervical high risk human papillomavirus (HPV) DNA test positive: Secondary | ICD-10-CM | POA: Insufficient documentation

## 2019-03-06 DIAGNOSIS — R87610 Atypical squamous cells of undetermined significance on cytologic smear of cervix (ASC-US): Secondary | ICD-10-CM | POA: Insufficient documentation

## 2019-03-06 NOTE — Progress Notes (Signed)
Referring Provider:  [facility] Reason: ASCUS PAP  HPI:  [name] is a 28 y.o.  G0P0000  who presents today for evaluation and management of abnormal cervical cytology.    Dysplasia History:  ASCUS +HPV PAP recently.  Reports prior abn PAP many years ago  ROS:  Pertinent items are noted in HPI.  OB History  Gravida Para Term Preterm AB Living  0 0 0 0 0 0  SAB TAB Ectopic Multiple Live Births  0 0 0 0 0    History reviewed. No pertinent past medical history.  History reviewed. No pertinent surgical history.  SOCIAL HISTORY: Social History   Substance and Sexual Activity  Alcohol Use Yes   Comment: social    Social History   Substance and Sexual Activity  Drug Use Never     History reviewed. No pertinent family history.  ALLERGIES:  Patient has no known allergies.  Current Outpatient Medications on File Prior to Visit  Medication Sig Dispense Refill  . Multiple Vitamins-Minerals (WOMENS MULTI GUMMIES PO) Take by mouth. 2 gummies PO QD    . norethindrone-ethinyl estradiol (MICROGESTIN) 1-20 MG-MCG tablet Take 1 tablet by mouth daily. 3 Package 0  . norethindrone-ethinyl estradiol (MICROGESTIN) 1-20 MG-MCG tablet Take 1 tablet by mouth daily. 1 Package 0   No current facility-administered medications on file prior to visit.     Physical Exam: -Vitals:  BP 108/64   Ht 5' 8.5" (1.74 m)   Wt 124 lb (56.2 kg)   LMP 02/15/2019 (Exact Date)   BMI 18.58 kg/m  GEN: WD, WN, NAD.  A+ O x 3, good mood and affect. ABD:  NT, ND.  Soft, no masses.  No hernias noted.   Pelvic:   Vulva: Normal appearance.  No lesions.  Vagina: No lesions or abnormalities noted.  Support: Normal pelvic support.  Urethra No masses tenderness or scarring.  Meatus Normal size without lesions or prolapse.  Cervix: See below.  Anus: Normal exam.  No lesions.  Perineum: Normal exam.  No lesions.        Bimanual   Uterus: Normal size.  Non-tender.  Mobile.  AV.  Adnexae: No masses.   Non-tender to palpation.  Cul-de-sac: Negative for abnormality.  Physical Exam Genitourinary:        PROCEDURE: 1.  Urine Pregnancy Test:  not done 2.  Colposcopy performed with 4% acetic acid after verbal consent obtained                                         -Aceto-white Lesions Location(s): None.              -Biopsy performed at 12, 6 o'clock               -ECC indicated and performed: Yes.       -Biopsy sites made hemostatic with pressure, AgNO3, and/or Monsel's solution   -Satisfactory colposcopy: Yes.      -Evidence of Invasive cervical CA :  NO  ASSESSMENT:  [name] is a 28 y.o. G0P0000 here for  1. ASCUS with positive high risk HPV cervical   .  PLAN: 1.  I discussed the grading system of pap smears and HPV high risk viral types.  We will discuss and base management after colpo results return. 2. Follow up PAP 6 months, vs intervention if high grade dysplasia identified 3. Treatment of persistantly  abnormal PAP smears and cervical dysplasia, even mild, is discussed w pt today in detail, as well as the pros and cons of Cryo and LEEP procedures. Will consider and discuss after results.      Barnett Applebaum, MD, [facility], [facility] 03/06/2019  2:49 PM

## 2019-03-06 NOTE — Progress Notes (Addendum)
Tried to call patient today for televisit for [facility] screening prior to her appointment this afternoon with Dr. Kenton Kingfisher.  No answer.  Left message to return my call.  Al Pimple, RN also called the patient and was able to speak to her directly.  Patient states she now has insurance through work.  At this time she is not eligible for [facility].  She agrees to keep her appointment with Dr. Kenton Kingfisher and will turn in her insurance.  Anne notified Denyse Dago at [facility].

## 2019-03-06 NOTE — Patient Instructions (Signed)

## 2019-03-08 LAB — SURGICAL PATHOLOGY

## 2019-03-08 NOTE — Progress Notes (Signed)
Reviewed letter and follow up plan. Patient to follow up per recs with Dr. Kenton Kingfisher.

## 2019-03-09 ENCOUNTER — Telehealth: Payer: Self-pay | Admitting: Obstetrics & Gynecology

## 2019-03-09 NOTE — Telephone Encounter (Signed)
Patient returning [facility] call.

## 2019-03-09 NOTE — Progress Notes (Signed)
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 LM to call Barnett Applebaum, MD, [facility], [facility] 03/09/2019  7:46 AM

## 2019-03-12 ENCOUNTER — Telehealth: Payer: Self-pay

## 2019-03-12 NOTE — Telephone Encounter (Signed)
Pt calling back. Does not get another break from work until 5:30.

## 2019-03-12 NOTE — Telephone Encounter (Signed)
Pt calling for results from last visit. Please advise.

## 2019-03-14 ENCOUNTER — Other Ambulatory Visit: Payer: Self-pay

## 2019-03-14 ENCOUNTER — Emergency Department
Admission: EM | Admit: 2019-03-14 | Discharge: 2019-03-14 | Disposition: A | Discharge status: Home / Self Care | Payer: No Typology Code available for payment source | Attending: Student in an Organized Health Care Education/Training Program | Admitting: Student in an Organized Health Care Education/Training Program

## 2019-03-14 DIAGNOSIS — Z79899 Other long term (current) drug therapy: Secondary | ICD-10-CM | POA: Diagnosis not present

## 2019-03-14 DIAGNOSIS — R05 Cough: Secondary | ICD-10-CM | POA: Diagnosis present

## 2019-03-14 DIAGNOSIS — J302 Other seasonal allergic rhinitis: Secondary | ICD-10-CM | POA: Insufficient documentation

## 2019-03-14 DIAGNOSIS — F17200 Nicotine dependence, unspecified, uncomplicated: Secondary | ICD-10-CM | POA: Insufficient documentation

## 2019-03-14 NOTE — Discharge Instructions (Signed)
Take zyrtec or cetirizine daily.  See primary care or return to the ER for symptoms of concern.

## 2019-03-14 NOTE — ED Notes (Signed)
Pt signed esignature.  D/c  inst to pt.  

## 2019-03-14 NOTE — ED Provider Notes (Signed)
[facility] Emergency Department Provider Note  ____________________________________________  Time seen: Approximately 9:12 PM  I have reviewed the triage vital signs and the nursing notes.   HISTORY  Chief Complaint Cough   HPI [name] is a 28 y.o. female who presents to the emergency department for treatment and evaluation of sore throat and sneezing.  No fever.  No other symptoms of concern.  She does have a history of bronchitis that she associates with season change.  No relief with DayQuil and NyQuil.  No known exposure to COVID-19    History reviewed. No pertinent past medical history.  Patient Active Problem List   Diagnosis Date Noted  . 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 No pertinent surgical history.  Prior to Admission medications   Medication Sig Start Date End Date Taking? Authorizing Provider  Multiple Vitamins-Minerals (WOMENS MULTI GUMMIES PO) Take by mouth. 2 gummies PO QD    [provider]  norethindrone-ethinyl estradiol (MICROGESTIN) 1-20 MG-MCG tablet Take 1 tablet by mouth daily. 01/02/19 02/03/19  Jerene Dilling, PA  norethindrone-ethinyl estradiol (MICROGESTIN) 1-20 MG-MCG tablet Take 1 tablet by mouth daily. 02/03/19 05/18/19  Jerene Dilling, PA    Allergies Patient has no known allergies.  History reviewed. No pertinent family history.  Social History Social History   Tobacco Use  . Smoking status: Current Every Day Smoker  . Smokeless tobacco: Never Used  Substance Use Topics  . Alcohol use: Yes    Comment: social   . Drug use: Never    Review of Systems Constitutional: Negative for fever/chills.  Normal appetite. ENT: Positive for sore throat. Positive for sneezing. Cardiovascular: Denies chest pain. Respiratory: Negative for shortness of breath. Negative for cough. Negative for wheezing.  Gastrointestinal: Negative for nausea,  no vomiting.  no  diarrhea.  Musculoskeletal: Negative for body aches Skin: Negative for rash. Neurological: Negative for headaches ____________________________________________   PHYSICAL EXAM:  VITAL SIGNS: ED Triage Vitals  Enc Vitals Group     BP 03/14/19 2034 (!) 128/93     Pulse Rate 03/14/19 2034 90     Resp 03/14/19 2034 17     Temp 03/14/19 2034 99.2 F (37.3 C)     Temp Source 03/14/19 2034 Oral     SpO2 03/14/19 2034 100 %     Weight 03/14/19 2033 121 lb (54.9 kg)     Height 03/14/19 2033 5\' 7"  (1.702 m)     Head Circumference --      Peak Flow --      Pain Score 03/14/19 2032 0     Pain Loc --      Pain Edu? --      Excl. in [place]? --     Constitutional: Alert and oriented. Well appearing and in no acute distress. Eyes: Conjunctivae are normal. Ears: Bilateral TM normal Nose: No sinus congestion noted; no rhinnorhea. Mouth/Throat: Mucous membranes are moist.  Oropharynx mildly erythematous. Tonsils mildly erythematous. Uvula midlin3. Neck: No stridor.  Lymphatic: No cervical lymphadenopathy. Cardiovascular: Normal rate, regular rhythm. Good peripheral circulation. Respiratory: Respirations are even and unlabored.  No retractions. Breath sounds clear.. Gastrointestinal: Soft and nontender.  Musculoskeletal: FROM x 4 extremities.  Neurologic:  Normal speech and language. Skin:  Skin is warm, dry and intact. No rash noted. Psychiatric: Mood and affect are normal. Speech and behavior are normal.  ____________________________________________   LABS (all labs ordered are listed, but only abnormal results are  displayed)  Labs Reviewed - No data to display ____________________________________________  EKG  Not indicated ____________________________________________  RADIOLOGY  Not indicated ____________________________________________   PROCEDURES  Procedure(s) performed: None  Critical Care performed: No ____________________________________________   INITIAL  IMPRESSION / ASSESSMENT AND PLAN / ED COURSE  28 y.o. female presenting to the emergency department for treatment and evaluation of sneezing and pharyngitis.  Exam is reassuring.  She is not meet the criteria for COVID-19 screening.  She was encouraged to take cetirizine or Zyrtec for symptoms.  If she develops any fever or worsening symptoms, she was encouraged to go to one of the drive-through testing centers for COVID-19 or return to the emergency department.     Medications - No data to display  ED Discharge Orders    None       Pertinent labs & imaging results that were available during my care of the patient were reviewed by me and considered in my medical decision making (see chart for details).    If controlled substance prescribed during this visit, 12 month history viewed on the [facility] prior to issuing an initial prescription for Schedule II or III opiod. ____________________________________________   FINAL CLINICAL IMPRESSION(S) / ED DIAGNOSES  Final diagnoses:  Seasonal allergies    Note:  This document was prepared using Dragon voice recognition software and may include unintentional dictation errors.    Chinita Pester, FNP 03/14/19 2211    Willy Eddy, MD 03/14/19 2233

## 2019-03-14 NOTE — ED Triage Notes (Signed)
Pt comes in for sore throat, sneezing. NO fevers. Pt reports hx of bronchitis.

## 2019-07-10 NOTE — Progress Notes (Signed)
Mychart message sent to patient re: pap f/u. Added to pap f/u log Richmond Campbell, RN

## 2022-08-17 ENCOUNTER — Encounter: Payer: Self-pay | Admitting: Nurse Practitioner

## 2022-08-27 ENCOUNTER — Ambulatory Visit
Admission: RE | Admit: 2022-08-27 | Discharge: 2022-08-27 | Disposition: A | Discharge status: Home / Self Care | Payer: Managed Care, Other (non HMO) | Source: Ambulatory Visit | Attending: Nurse Practitioner | Admitting: Nurse Practitioner

## 2022-08-27 ENCOUNTER — Ambulatory Visit (INDEPENDENT_AMBULATORY_CARE_PROVIDER_SITE_OTHER): Payer: Managed Care, Other (non HMO) | Admitting: Nurse Practitioner

## 2022-08-27 ENCOUNTER — Encounter: Payer: Self-pay | Admitting: Nurse Practitioner

## 2022-08-27 ENCOUNTER — Ambulatory Visit
Admission: RE | Admit: 2022-08-27 | Discharge: 2022-08-27 | Disposition: A | Discharge status: Home / Self Care | Payer: Managed Care, Other (non HMO) | Source: Ambulatory Visit | Attending: Nurse Practitioner | Admitting: *Deleted

## 2022-08-27 VITALS — BP 116/60 | HR 92 | Temp 98.9°F | Ht 68.0 in | Wt 121.8 lb

## 2022-08-27 DIAGNOSIS — F4310 Post-traumatic stress disorder, unspecified: Secondary | ICD-10-CM | POA: Diagnosis not present

## 2022-08-27 DIAGNOSIS — M25562 Pain in left knee: Secondary | ICD-10-CM | POA: Diagnosis present

## 2022-08-27 DIAGNOSIS — F32A Depression, unspecified: Secondary | ICD-10-CM

## 2022-08-27 DIAGNOSIS — F419 Anxiety disorder, unspecified: Secondary | ICD-10-CM

## 2022-08-27 MED ORDER — SERTRALINE HCL 25 MG PO TABS: 25.0000 mg | ORAL_TABLET | Freq: Every day | ORAL | 0 refills | Status: DC

## 2022-08-27 NOTE — Assessment & Plan Note (Addendum)
Chronic issue. PHQ- 12 and GAD- 8 today. Will start patient on Zoloft 25 mg daily. Counseled patient on common side effects. Encouraged to contact if worsening symptoms, unusual behavior changes or suicidal thoughts occur. Denies SI/HI today. Hx of self harm- none in the last 3 years. She will follow up in 4-6 weeks.

## 2022-08-27 NOTE — Assessment & Plan Note (Signed)
X-ray today. Will contact patient with results. No swelling noted on exam. Encouraged patient to rest, ice, and elevate. She can take Tylenol/Ibuprofen for pain. She politely declined a referral to Ortho today. She would like to monitor and try more conservative treatment. If her pain continues she will let me know and we will refer to Ortho. Will monitor.
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
## 2022-09-22 ENCOUNTER — Encounter: Payer: Self-pay | Admitting: Nurse Practitioner

## 2022-09-23 ENCOUNTER — Other Ambulatory Visit: Payer: Self-pay | Admitting: Nurse Practitioner

## 2022-09-23 DIAGNOSIS — M25562 Pain in left knee: Secondary | ICD-10-CM

## 2022-10-01 ENCOUNTER — Encounter: Payer: Self-pay | Admitting: Nurse Practitioner

## 2022-10-01 ENCOUNTER — Ambulatory Visit (INDEPENDENT_AMBULATORY_CARE_PROVIDER_SITE_OTHER): Payer: Managed Care, Other (non HMO) | Admitting: Nurse Practitioner

## 2022-10-01 ENCOUNTER — Other Ambulatory Visit (HOSPITAL_COMMUNITY)
Admission: RE | Admit: 2022-10-01 | Discharge: 2022-10-01 | Disposition: A | Discharge status: Home / Self Care | Payer: Managed Care, Other (non HMO) | Source: Ambulatory Visit | Attending: Nurse Practitioner | Admitting: Nurse Practitioner

## 2022-10-01 ENCOUNTER — Telehealth: Payer: Self-pay | Admitting: Nurse Practitioner

## 2022-10-01 VITALS — BP 110/70 | HR 69 | Temp 99.5°F | Ht 68.0 in | Wt 121.4 lb

## 2022-10-01 DIAGNOSIS — F32A Depression, unspecified: Secondary | ICD-10-CM

## 2022-10-01 DIAGNOSIS — Z23 Encounter for immunization: Secondary | ICD-10-CM | POA: Diagnosis not present

## 2022-10-01 DIAGNOSIS — Z114 Encounter for screening for human immunodeficiency virus [HIV]: Secondary | ICD-10-CM

## 2022-10-01 DIAGNOSIS — Z1329 Encounter for screening for other suspected endocrine disorder: Secondary | ICD-10-CM | POA: Diagnosis not present

## 2022-10-01 DIAGNOSIS — Z124 Encounter for screening for malignant neoplasm of cervix: Secondary | ICD-10-CM | POA: Diagnosis present

## 2022-10-01 DIAGNOSIS — Z0000 Encounter for general adult medical examination without abnormal findings: Secondary | ICD-10-CM | POA: Diagnosis not present

## 2022-10-01 DIAGNOSIS — F419 Anxiety disorder, unspecified: Secondary | ICD-10-CM

## 2022-10-01 DIAGNOSIS — F4310 Post-traumatic stress disorder, unspecified: Secondary | ICD-10-CM | POA: Diagnosis not present

## 2022-10-01 DIAGNOSIS — Z1159 Encounter for screening for other viral diseases: Secondary | ICD-10-CM

## 2022-10-01 DIAGNOSIS — N920 Excessive and frequent menstruation with regular cycle: Secondary | ICD-10-CM

## 2022-10-01 DIAGNOSIS — Z01419 Encounter for gynecological examination (general) (routine) without abnormal findings: Secondary | ICD-10-CM | POA: Insufficient documentation

## 2022-10-01 DIAGNOSIS — Z13220 Encounter for screening for lipoid disorders: Secondary | ICD-10-CM

## 2022-10-01 MED ORDER — NORETHINDRONE ACET-ETHINYL EST 1-20 MG-MCG PO TABS
1.0000 | ORAL_TABLET | Freq: Every day | ORAL | 3 refills | Status: DC
Start: 2022-10-01 — End: 2023-11-18

## 2022-10-01 MED ORDER — SERTRALINE HCL 25 MG PO TABS
25.0000 mg | ORAL_TABLET | Freq: Every day | ORAL | 0 refills | Status: DC
Start: 2022-10-01 — End: 2022-12-29

## 2022-10-01 NOTE — Telephone Encounter (Signed)
Patient dropped off document  work paperwork , to be filled out by provider. Patient requested to send it via Call Patient to pick up within 5-days. Document is located in providers tray at front office.Please advise at Mobile [phone] (mobile)   Paperwork are back there with lester.

## 2022-10-01 NOTE — Progress Notes (Unsigned)
Bethanie Dicker, NP-C Phone: [phone]  [name] is a 32 y.o. female who presents today for annual exam and pap smear. She would like to start back on her birth control pills. She was recently treated for mastitis by Urgent Care, her symptoms have resolved. She never picked up her Zoloft prescription from last month. She is still interested in starting on the medication and is requesting it be sent to a different pharmacy. Denies worsening symptoms. Denies SI/HI.   Diet: Poor- does not cook much at home, quick, grab and go foods. Exercise: None Pap smear: 11/03/2018- Due! Family history-  Colon cancer: Yes, maternal grandfather  Breast cancer: No  Ovarian cancer: No Vaccines-   Flu: Not due  Tetanus: 10+ years ago, due!  COVID19: x 2 HIV screening: Today Hep C Screening: Today Tobacco use: Yes, 1 black and mild per day, trying to quit Alcohol use: Yes, occasionally- once a month Illicit Drug use: Yes, occasionally smokes marijuana Dentist: Yes Ophthalmology: Yes  [id number] Wt Readings from Last 3 Encounters:  10/01/22 121 lb 6.4 oz (55.1 kg)  08/27/22 121 lb 12.8 oz (55.2 kg)  03/14/19 121 lb (54.9 kg)   Last period: 09/25/2022 Regular periods: Yes Heavy bleeding: Yes  Sexually active: no Birth control or hormonal therapy: no, used to be on Microgestin, would like to start back on it for her heavy periods Hx of STD: Patient desires STD screening Dyspareunia: No Hot flashes: No Vaginal discharge: No Dysuria: No  Breast mass or concerns: No- recent treated for mastitis- resolved.  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 Counseling provided.     No current  outpatient medications on file prior to visit.   No current facility-administered medications on file prior to visit.    ROS see history of present illness  Objective  Physical Exam Vitals:   10/01/22 1429  BP: 110/70  Pulse: 69  Temp: 99.5 F (37.5 C)  SpO2: 99%    BP Readings from Last 3 Encounters:  10/01/22 110/70  08/27/22 116/60  03/14/19 (!) 128/93   Wt Readings from Last 3 Encounters:  10/01/22 121 lb 6.4 oz (55.1 kg)  08/27/22 121 lb 12.8 oz (55.2 kg)  03/14/19 121 lb (54.9 kg)    Physical Exam Exam conducted with a chaperone present Donavan Foil, CMA).  Constitutional:      General: She is not in acute distress.    Appearance: Normal appearance.  HENT:     Head: Normocephalic.     Right Ear: Tympanic membrane normal.     Left Ear: Tympanic membrane normal.     Nose: Nose normal.     Mouth/Throat:     Mouth: Mucous membranes are moist.     Pharynx: Oropharynx is clear.  Eyes:     Conjunctiva/sclera: Conjunctivae normal.     Pupils: Pupils are equal, round, and reactive to light.  Neck:     Thyroid: No thyromegaly.  Cardiovascular:     Rate and Rhythm: Normal rate and regular rhythm.     Heart sounds: Normal heart sounds.  Pulmonary:     Effort: Pulmonary effort is  normal.     Breath sounds: Normal breath sounds.  Chest:  Breasts:    Right: Normal. No inverted nipple, mass, nipple discharge or tenderness.     Left: Normal. No inverted nipple, mass, nipple discharge or tenderness.  Abdominal:     General: Abdomen is flat. Bowel sounds are normal.     Palpations: Abdomen is soft. There is no mass.     Tenderness: There is no abdominal tenderness.  Genitourinary:    General: Normal vulva.     Labia:        Right: No rash or lesion.        Left: No rash or lesion.      Vagina: Normal. No vaginal discharge, tenderness or bleeding.     Cervix: No discharge, lesion, erythema or cervical bleeding.     Uterus: Normal.   Musculoskeletal:         General: Normal range of motion.  Lymphadenopathy:     Cervical: No cervical adenopathy.  Skin:    General: Skin is warm and dry.     Findings: No rash.  Neurological:     General: No focal deficit present.     Mental Status: She is alert.  Psychiatric:        Mood and Affect: Mood normal.        Behavior: Behavior normal.    Assessment/Plan: Please see individual problem list.  Well woman exam with routine gynecological exam Assessment & Plan: Physical exam complete. Lab work as outlined. Will contact patient with results. Pap- today! Flu vaccine not due. Tetanus vaccine given today in office. Declined additional COVID vaccines. HIV/Hep C screenings today. Counseled on tobacco and marijuana cessation. Recommended follow ups with Dentist and Ophthalmology for annual exams. Encouraged to work on [employer] and begin exercising. Return to care in 4 weeks.  Orders: -     CBC with Differential/Platelet -     Comprehensive metabolic panel  Menorrhagia with regular cycle Assessment & Plan: Will start patient back on Microgestin. Counseled on tobacco cessation. Counseled on using back up contraception for the first week after starting medication if she decides to become sexually active. Will monitor.   Orders: -     Norethindrone Acet-Ethinyl Est; Take 1 tablet by mouth daily.  Dispense: 84 tablet; Refill: 3  Anxiety and depression Assessment & Plan: Patient never picked up Zoloft 25 mg from visit last month. Rx sent to new pharmacy. She will start the medication and follow up in 4 weeks. Denies worsening symptoms. Denies SI/HI. Counseled patient on common side effects. Encouraged to contact if worsening symptoms, unusual behavior changes or suicidal thoughts occur. Will continue to monitor.   Orders: -     VITAMIN D 25 Hydroxy (Vit-D Deficiency, Fractures) -     Sertraline HCl; Take 1 tablet (25 mg total) by mouth daily.  Dispense: 90 tablet; Refill: 0  PTSD (post-traumatic stress  disorder) -     Sertraline HCl; Take 1 tablet (25 mg total) by mouth daily.  Dispense: 90 tablet; Refill: 0  Screening for cervical cancer -     Cytology - PAP  Thyroid disorder screen -     TSH  Lipid screening -     Lipid panel  Need for Tdap vaccination -     Tdap vaccine greater than or equal to 31yo IM  Encounter for hepatitis C screening test for low risk patient -     Hepatitis C antibody  Encounter for screening for  HIV -     HIV Antibody (routine testing w rflx)   Return in about 4 weeks (around 10/29/2022) for Anxiety/Depression.   Bethanie Dicker, NP-C [facility] - [employer]

## 2022-10-02 LAB — VITAMIN D 25 HYDROXY (VIT D DEFICIENCY, FRACTURES): Vit D, 25-Hydroxy: 10.9 ng/mL — ABNORMAL LOW (ref 30.0–100.0)

## 2022-10-02 LAB — CBC WITH DIFFERENTIAL/PLATELET
Basophils Absolute: 0.1 10*3/uL (ref 0.0–0.2)
Basos: 1 %
EOS (ABSOLUTE): 0.1 10*3/uL (ref 0.0–0.4)
Eos: 1 %
Hematocrit: 36.7 % (ref 34.0–46.6)
Hemoglobin: 11.7 g/dL (ref 11.1–15.9)
Immature Grans (Abs): 0 10*3/uL (ref 0.0–0.1)
Immature Granulocytes: 0 %
Lymphocytes Absolute: 2.8 10*3/uL (ref 0.7–3.1)
Lymphs: 45 %
MCH: 30 pg (ref 26.6–33.0)
MCHC: 31.9 g/dL (ref 31.5–35.7)
MCV: 94 fL (ref 79–97)
Monocytes Absolute: 0.4 10*3/uL (ref 0.1–0.9)
Monocytes: 7 %
Neutrophils Absolute: 3 10*3/uL (ref 1.4–7.0)
Neutrophils: 46 %
Platelets: 254 10*3/uL (ref 150–450)
RBC: 3.9 x10E6/uL (ref 3.77–5.28)
RDW: 12.6 % (ref 11.7–15.4)
WBC: 6.3 10*3/uL (ref 3.4–10.8)

## 2022-10-02 LAB — COMPREHENSIVE METABOLIC PANEL
ALT: 12 IU/L (ref 0–32)
AST: 18 IU/L (ref 0–40)
Albumin/Globulin Ratio: 1.8 (ref 1.2–2.2)
Albumin: 4.4 g/dL (ref 4.0–5.0)
Alkaline Phosphatase: 55 IU/L (ref 44–121)
BUN/Creatinine Ratio: 9 (ref 9–23)
BUN: 7 mg/dL (ref 6–20)
Bilirubin Total: 0.5 mg/dL (ref 0.0–1.2)
CO2: 23 mmol/L (ref 20–29)
Calcium: 9.5 mg/dL (ref 8.7–10.2)
Chloride: 103 mmol/L (ref 96–106)
Creatinine, Ser: 0.77 mg/dL (ref 0.57–1.00)
Globulin, Total: 2.5 g/dL (ref 1.5–4.5)
Glucose: 75 mg/dL (ref 70–99)
Potassium: 4.2 mmol/L (ref 3.5–5.2)
Sodium: 139 mmol/L (ref 134–144)
Total Protein: 6.9 g/dL (ref 6.0–8.5)
eGFR: 106 mL/min/{1.73_m2} (ref >59)

## 2022-10-02 LAB — LIPID PANEL
Chol/HDL Ratio: 3.1 ratio (ref 0.0–4.4)
Cholesterol, Total: 156 mg/dL (ref 100–199)
HDL: 50 mg/dL (ref >39)
LDL Chol Calc (NIH): 94 mg/dL (ref 0–99)
Triglycerides: 59 mg/dL (ref 0–149)
VLDL Cholesterol Cal: 12 mg/dL (ref 5–40)

## 2022-10-02 LAB — TSH: TSH: 0.603 u[IU]/mL (ref 0.450–4.500)

## 2022-10-02 LAB — HIV ANTIBODY (ROUTINE TESTING W REFLEX): HIV 1&2 Ab, 4th Generation: NONREACTIVE

## 2022-10-02 LAB — HEPATITIS C ANTIBODY: Hepatitis C Ab: NONREACTIVE

## 2022-10-04 ENCOUNTER — Other Ambulatory Visit: Payer: Self-pay | Admitting: Nurse Practitioner

## 2022-10-04 ENCOUNTER — Encounter: Payer: Self-pay | Admitting: Nurse Practitioner

## 2022-10-04 DIAGNOSIS — E559 Vitamin D deficiency, unspecified: Secondary | ICD-10-CM | POA: Insufficient documentation

## 2022-10-04 MED ORDER — VITAMIN D (ERGOCALCIFEROL) 1.25 MG (50000 UNIT) PO CAPS
50000.0000 [IU] | ORAL_CAPSULE | ORAL | 1 refills | Status: DC
Start: 2022-10-04 — End: 2023-11-25

## 2022-10-04 NOTE — Telephone Encounter (Signed)
Form has been placed in envelope and set in designated pick up area called and LMIOM informing as well as sent a mychart.

## 2022-10-04 NOTE — Assessment & Plan Note (Addendum)
Will start patient back on Microgestin. Counseled on tobacco cessation. Counseled on using back up contraception for the first week after starting medication if she decides to become sexually active. Will monitor.

## 2022-10-04 NOTE — Assessment & Plan Note (Signed)
Patient never picked up Zoloft 25 mg from visit last month. Rx sent to new pharmacy. She will start the medication and follow up in 4 weeks. Denies worsening symptoms. Denies SI/HI. Counseled patient on common side effects. Encouraged to contact if worsening symptoms, unusual behavior changes or suicidal thoughts occur. Will continue to monitor.

## 2022-10-04 NOTE — Assessment & Plan Note (Signed)
Physical exam complete. Lab work as outlined. Will contact patient with results. Pap- today! Flu vaccine not due. Tetanus vaccine given today in office. Declined additional COVID vaccines. HIV/Hep C screenings today. Counseled on tobacco and marijuana cessation. Recommended follow ups with Dentist and Ophthalmology for annual exams. Encouraged to work on [employer] and begin exercising. Return to care in 4 weeks.

## 2022-10-05 LAB — CYTOLOGY - PAP
Chlamydia: NEGATIVE
Comment: NEGATIVE
Comment: NEGATIVE
Comment: NEGATIVE
Comment: NORMAL
Diagnosis: NEGATIVE
High risk HPV: NEGATIVE
Neisseria Gonorrhea: NEGATIVE
Trichomonas: NEGATIVE

## 2022-10-29 ENCOUNTER — Ambulatory Visit: Payer: Managed Care, Other (non HMO) | Admitting: Nurse Practitioner

## 2022-11-10 ENCOUNTER — Encounter: Payer: Self-pay | Admitting: Nurse Practitioner

## 2022-12-27 ENCOUNTER — Other Ambulatory Visit: Payer: Self-pay | Admitting: Nurse Practitioner

## 2022-12-27 DIAGNOSIS — F4310 Post-traumatic stress disorder, unspecified: Secondary | ICD-10-CM

## 2022-12-27 DIAGNOSIS — F419 Anxiety disorder, unspecified: Secondary | ICD-10-CM

## 2022-12-30 ENCOUNTER — Telehealth: Payer: Self-pay | Admitting: Nurse Practitioner

## 2022-12-30 NOTE — Telephone Encounter (Signed)
Patient is requesting a CD made of the x ray from 08/27/2022 of patient's knee. Please call when ready for pick up. She would like to pick up CD tomorrow.

## 2022-12-31 NOTE — Telephone Encounter (Signed)
Pt notified that CD is ready for pickup & placed up front.

## 2023-03-28 ENCOUNTER — Other Ambulatory Visit: Payer: Self-pay | Admitting: Nurse Practitioner

## 2023-03-28 DIAGNOSIS — F32A Depression, unspecified: Secondary | ICD-10-CM

## 2023-03-28 DIAGNOSIS — F4310 Post-traumatic stress disorder, unspecified: Secondary | ICD-10-CM

## 2023-04-20 ENCOUNTER — Other Ambulatory Visit: Payer: Self-pay | Admitting: Nurse Practitioner

## 2023-04-20 DIAGNOSIS — E559 Vitamin D deficiency, unspecified: Secondary | ICD-10-CM

## 2023-05-27 ENCOUNTER — Emergency Department: Payer: Managed Care, Other (non HMO)

## 2023-05-27 ENCOUNTER — Other Ambulatory Visit: Payer: Self-pay

## 2023-05-27 ENCOUNTER — Emergency Department
Admission: EM | Admit: 2023-05-27 | Discharge: 2023-05-28 | Disposition: A | Discharge status: Home / Self Care | Payer: Managed Care, Other (non HMO) | Attending: Emergency Medicine | Admitting: Emergency Medicine

## 2023-05-27 DIAGNOSIS — H02401 Unspecified ptosis of right eyelid: Secondary | ICD-10-CM | POA: Insufficient documentation

## 2023-05-27 DIAGNOSIS — R519 Headache, unspecified: Secondary | ICD-10-CM | POA: Insufficient documentation

## 2023-05-27 DIAGNOSIS — J45909 Unspecified asthma, uncomplicated: Secondary | ICD-10-CM | POA: Diagnosis not present

## 2023-05-27 LAB — CBC
HCT: 37.4 % (ref 36.0–46.0)
Hemoglobin: 12.7 g/dL (ref 12.0–15.0)
MCH: 31.1 pg (ref 26.0–34.0)
MCHC: 34 g/dL (ref 30.0–36.0)
MCV: 91.7 fL (ref 80.0–100.0)
Platelets: 272 10*3/uL (ref 150–400)
RBC: 4.08 MIL/uL (ref 3.87–5.11)
RDW: 13.1 % (ref 11.5–15.5)
WBC: 6.7 10*3/uL (ref 4.0–10.5)
nRBC: 0 % (ref 0.0–0.2)

## 2023-05-27 LAB — PROTIME-INR
INR: 1 (ref 0.8–1.2)
Prothrombin Time: 13.7 s (ref 11.4–15.2)

## 2023-05-27 LAB — DIFFERENTIAL
Abs Immature Granulocytes: 0.01 10*3/uL (ref 0.00–0.07)
Basophils Absolute: 0.1 10*3/uL (ref 0.0–0.1)
Basophils Relative: 1 %
Eosinophils Absolute: 0.1 10*3/uL (ref 0.0–0.5)
Eosinophils Relative: 1 %
Immature Granulocytes: 0 %
Lymphocytes Relative: 45 %
Lymphs Abs: 3 10*3/uL (ref 0.7–4.0)
Monocytes Absolute: 0.4 10*3/uL (ref 0.1–1.0)
Monocytes Relative: 6 %
Neutro Abs: 3.1 10*3/uL (ref 1.7–7.7)
Neutrophils Relative %: 47 %

## 2023-05-27 LAB — CBG MONITORING, ED: Glucose-Capillary: 105 mg/dL — ABNORMAL HIGH (ref 70–99)

## 2023-05-27 LAB — COMPREHENSIVE METABOLIC PANEL
ALT: 12 U/L (ref 0–44)
AST: 20 U/L (ref 15–41)
Albumin: 4.2 g/dL (ref 3.5–5.0)
Alkaline Phosphatase: 40 U/L (ref 38–126)
Anion gap: 11 (ref 5–15)
BUN: 7 mg/dL (ref 6–20)
CO2: 26 mmol/L (ref 22–32)
Calcium: 9.2 mg/dL (ref 8.9–10.3)
Chloride: 103 mmol/L (ref 98–111)
Creatinine, Ser: 0.78 mg/dL (ref 0.44–1.00)
GFR, Estimated: >60 mL/min (ref >60)
Glucose, Bld: 99 mg/dL (ref 70–99)
Potassium: 3.3 mmol/L — ABNORMAL LOW (ref 3.5–5.1)
Sodium: 140 mmol/L (ref 135–145)
Total Bilirubin: 0.3 mg/dL (ref 0.0–1.2)
Total Protein: 7.4 g/dL (ref 6.5–8.1)

## 2023-05-27 LAB — ETHANOL: Alcohol, Ethyl (B): <10 mg/dL (ref <10)

## 2023-05-27 LAB — APTT: aPTT: 30 s (ref 24–36)

## 2023-05-27 MED ORDER — ACETAMINOPHEN 500 MG PO TABS: 1000.0000 mg | ORAL_TABLET | Freq: Once | ORAL | Status: DC

## 2023-05-27 MED ORDER — SODIUM CHLORIDE 0.9% FLUSH: 3.0000 mL | Freq: Once | INTRAVENOUS | Status: DC

## 2023-05-27 NOTE — ED Provider Notes (Signed)
[facility] Provider Note    Event Date/Time   First MD Initiated Contact with Patient 05/27/23 2145     (approximate)   History   No chief complaint on file.   HPI  [name] is a 33 y.o. female with history of anxiety, asthma, on oral contraceptive pills, presenting with right-sided headache.  States that has been ongoing for the last 3 days, states that she noticed her right eyelid will be drooping, last for several minutes and then self resolved.  Noticed it again this morning and presented here for further workup.  She denies any trauma or falls.  States that she has had headaches in the past but not associated with neurological symptoms.  She denies any rash, puffiness to her eyes, slurred speech, weakness, numbness, inability to walk, dizziness.  States that she is on the 1 week where she is off her contraceptive pills.  No history of blood clots, no family history of MS or myasthenia gravis or early strokes.     Physical Exam   Triage Vital Signs: ED Triage Vitals [05/27/23 1419]  Encounter Vitals Group     BP (!) 130/91     Systolic BP Percentile      Diastolic BP Percentile      Pulse Rate 73     Resp 17     Temp 98 F (36.7 C)     Temp Source Oral     SpO2 95 %     Weight 125 lb (56.7 kg)     Height 5\' 8"  (1.727 m)     Head Circumference      Peak Flow      Pain Score 6     Pain Loc      Pain Education      Exclude from Growth Chart     Most recent vital signs: Vitals:   05/27/23 1419  BP: (!) 130/91  Pulse: 73  Resp: 17  Temp: 98 F (36.7 C)  SpO2: 95%     General: Awake, no distress.  CV:  Good peripheral perfusion.  Resp:  Normal effort.  Abd:  No distention.  Other:  Pupils are equal reactive, extraocular movements are intact, no cranial nerve deficits, no ptosis, no focal weakness or numbness.  She has steady ambulation.   ED Results / Procedures / Treatments   Labs (all labs ordered are listed, but only  abnormal results are displayed) Labs Reviewed  COMPREHENSIVE METABOLIC PANEL - Abnormal; Notable for the following components:      Result Value   Potassium 3.3 (*)    All other components within normal limits  CBG MONITORING, ED - Abnormal; Notable for the following components:   Glucose-Capillary 105 (*)    All other components within normal limits  PROTIME-INR  APTT  CBC  DIFFERENTIAL  ETHANOL  CBG MONITORING, ED  POC URINE PREG, ED     EKG  Sinus rhythm, rate of 72, normal QS, normal QTc, T wave flattening to inferior lateral leads, no ischemic ST elevation, no prior to compare   RADIOLOGY CT head on my interpretation without intracranial hemorrhage   PROCEDURES:  Critical Care performed: No  Procedures   MEDICATIONS ORDERED IN ED: Medications  sodium chloride flush (NS) 0.9 % injection 3 mL (has no administration in time range)  acetaminophen (TYLENOL) tablet 1,000 mg (has no administration in time range)     IMPRESSION / MDM / ASSESSMENT AND PLAN / ED COURSE  I reviewed the triage vital signs and the nursing notes.                              Differential diagnosis includes, but is not limited to, complex migraine, MS, considered CVA or but patient has no other risk factors for it, she is neurologically intact, no focal neurodeficits.  Also considered sinus venous thrombosis.  Labs, EKG and a CT head was done out of triage, independent review of labs, no severe electrolyte derangements, alcohol is not elevated, no leukocytosis, CT head is negative for acute process.  Shared decision making done with patient and discussed getting a CTV to workup sinus venous thrombosis, but patient would rather be discharged with outpatient follow-up with neurology.  Understands the risk of missing a clot in her head.  I do think that sinus venous thrombosis is the differential but possibly lower given that she has no focal deficits at this time.  I will discharge her with very  strict return precautions.  Will also give her number to call for neurology follow-up.  Patient's presentation is most consistent with acute presentation with potential threat to life or bodily function.        FINAL CLINICAL IMPRESSION(S) / ED DIAGNOSES   Final diagnoses:  Acute nonintractable headache, unspecified headache type  Ptosis of right eyelid     Rx / DC Orders   ED Discharge Orders     None        Note:  This document was prepared using Dragon voice recognition software and may include unintentional dictation errors.    Claybon Jabs, MD 05/27/23 2207

## 2023-05-27 NOTE — ED Triage Notes (Signed)
Pt was brought over from [facility] for right eye droop off and on for the last three days with head pressure and blurred vision. Pt is A/Ox4 at this time. NAD Pt does any other s/s at this time besides headache and little dizzy.

## 2023-05-27 NOTE — ED Provider Triage Note (Signed)
Emergency Medicine Provider Triage Evaluation Note  [name] , a 33 y.o. female  was evaluated in triage.  Pt complains of headache for 3 days with intermittent left side facial droop that occurred while at [facility] clinic. "Last episode approx 90 minutes ago".   No medication taken for headache.    Review of Systems  Positive: Headache, facial droop, + blurred vision Negative: No N, V, fever or chills  Physical Exam  BP (!) 130/91 (BP Location: Left Arm)   Pulse 73   Temp 98 F (36.7 C) (Oral)   Resp 17   Ht 5\' 8"  (1.727 m)   Wt 56.7 kg   LMP 05/19/2023 (Exact Date)   SpO2 95%   BMI 19.01 kg/m  Gen:   Awake, no distress  Alert and talkative Resp:  Normal effort  MSK:   Moves extremities without difficulty   Ambulates without difficulty Other:  No facial droop noted.  Normal speech.    Medical Decision Making  Medically screening exam initiated at 2:23 PM.  Appropriate orders placed.  [name] was informed that the remainder of the evaluation will be completed by another provider, this initial triage assessment does not replace that evaluation, and the importance of remaining in the ED until their evaluation is complete.     Tommi Rumps, PA-C 05/27/23 1427

## 2023-05-27 NOTE — ED Notes (Signed)
Pt to desk asking "why is it taking so long I been here for hours and I already have my results what am I waiting on now?" Pt was advised she is waiting on MD to see her and clear her. Pt went outside.

## 2023-05-27 NOTE — Discharge Instructions (Signed)
Return if you have persistent or worsening headache, if you have recurrence of the drooping eyelid, if you have weakness, numbness, slurred speech or double vision.

## 2023-06-20 ENCOUNTER — Encounter: Payer: Self-pay | Admitting: Obstetrics

## 2023-06-20 ENCOUNTER — Ambulatory Visit (INDEPENDENT_AMBULATORY_CARE_PROVIDER_SITE_OTHER): Payer: Managed Care, Other (non HMO) | Admitting: Obstetrics

## 2023-06-20 VITALS — BP 110/78 | HR 98 | Ht 68.0 in | Wt 125.0 lb

## 2023-06-20 DIAGNOSIS — N6323 Unspecified lump in the left breast, lower outer quadrant: Secondary | ICD-10-CM | POA: Diagnosis not present

## 2023-06-20 DIAGNOSIS — G8929 Other chronic pain: Secondary | ICD-10-CM | POA: Insufficient documentation

## 2023-06-20 DIAGNOSIS — N644 Mastodynia: Secondary | ICD-10-CM

## 2023-06-20 NOTE — Progress Notes (Signed)
 GYNECOLOGY PROGRESS NOTE  Subjective:  PCP: Bethanie Dicker, NP  Patient ID: [name], female    DOB: 10/17/1990, 33 y.o.   MRN: [id number]  HPI  Patient is a 33 y.o. G0P0000 female who presents for pain in left breast that comes and goes for the past year. 1 year ago had mastitis in the area that continues to be painful, she developed redness, pain, fever and was treated with antibiotics. Then this past Dec, 17 2024 she had a rash around her areola that was very itchy, did not seek treatment and did not put anything on it, rash self-resolved. Since the mastitis, pt continues to have a dull ache in her L breast that waxes and wanes, in her outer lower breast. Denies [facility] of breast cancer. She does smoke daily. She takes COCs for contraception.  Period Cycle (Days): 30 Period Duration (Days): 5-7 Period Pattern: Regular Menstrual Flow: Moderate Menstrual Control: Thin pad, Maxi pad, Hospital pad, Panty liner Menstrual Control Change Freq (Hours): 2-3 Dysmenorrhea: (!) Moderate Dysmenorrhea Symptoms: Cramping  OB History     Gravida  0   Para  0   Term  0   Preterm  0   AB  0   Living  0      SAB  0   IAB  0   Ectopic  0   Multiple  0   Live Births  0          Past Medical History:  Diagnosis Date   Anxiety 2018   Asthma    Depression    Heart murmur    I was a teenager. Cannot remember the date   History of fainting spells of unknown cause    Past Surgical History:  Procedure Laterality Date   APPENDECTOMY  2014   COLON SURGERY  2014   Family History  Problem Relation Age of Onset   Hypertension Mother    COPD Mother    COPD Maternal Grandmother    Alcohol abuse Maternal Grandfather    COPD Maternal Grandfather    COPD Paternal Grandfather    Social History   Socioeconomic History   Marital status: Single    Spouse name: Not on file   Number of children: Not on file   Years of education: Not on file   Highest education level: Not on  file  Occupational History   Not on file  Tobacco Use   Smoking status: Every Day    Current packs/day: 0.50    Average packs/day: 0.5 packs/day for 10.0 years (5.0 ttl pk-yrs)    Types: Cigarettes, Cigars   Smokeless tobacco: Never   Tobacco comments:    1 black and mild per day- wanting to quit. Counseling provided.   Vaping Use   Vaping status: Never Used  Substance and Sexual Activity   Alcohol use: Yes    Comment: social    Drug use: Never   Sexual activity: Yes    Birth control/protection: Condom, Pill  Other Topics Concern   Not on file  Social History Narrative   Not on file   Social Drivers of Health   Financial Resource Strain: Not on file  Food Insecurity: Not on file  Transportation Needs: Not on file  Physical Activity: Not on file  Stress: Not on file  Social Connections: Not on file  Intimate Partner Violence: Not on file   Current Outpatient Medications on File Prior to Visit  Medication Sig Dispense Refill  norethindrone-ethinyl estradiol (MICROGESTIN) 1-20 MG-MCG tablet Take 1 tablet by mouth daily. 84 tablet 3   sertraline (ZOLOFT) 25 MG tablet TAKE 1 TABLET(25 MG) BY MOUTH DAILY 90 tablet 0   Vitamin D, Ergocalciferol, (DRISDOL) 1.25 MG (50000 UNIT) CAPS capsule Take 1 capsule (50,000 Units total) by mouth every 7 (seven) days. 13 capsule 1   No current facility-administered medications on file prior to visit.   No Known Allergies  Review of Systems Pertinent items are noted in HPI.   Objective:   Blood pressure 110/78, pulse 98, height 5\' 8"  (1.727 m), weight 125 lb (56.7 kg), last menstrual period 06/11/2023. Body mass index is 19.01 kg/m.  Physical Exam Vitals and nursing note reviewed.  Constitutional:      Appearance: Normal appearance.  HENT:     Head: Normocephalic and atraumatic.  Eyes:     Extraocular Movements: Extraocular movements intact.  Cardiovascular:     Rate and Rhythm: Normal rate.  Pulmonary:     Effort: Pulmonary  effort is normal.  Chest:  Breasts:    Right: Normal. No swelling, bleeding, inverted nipple, mass, nipple discharge, skin change or tenderness.     Left: Mass and tenderness present. No swelling, bleeding, inverted nipple, nipple discharge or skin change.    Musculoskeletal:        General: Normal range of motion.     Cervical back: Normal range of motion.  Lymphadenopathy:     Upper Body:     Right upper body: No supraclavicular, axillary or pectoral adenopathy.     Left upper body: No supraclavicular, axillary or pectoral adenopathy.  Neurological:     General: No focal deficit present.     Mental Status: She is alert.  Psychiatric:        Mood and Affect: Mood normal.    Assessment/Plan:   1. Chronic pain of breast   2. Mass of lower outer quadrant of left breast     Likely non-pathologic, but with lump felt today in area of pain, will obtain imaging to be safe. We discussed past episode of mastitis and rash are unlikely related to her chronic pain, which is more likely a fibroadenoma or breast cyst. We reviewed home remedies for breast soreness and if imaging comes back clear, she can follow up as needed.    Julieanne Manson, DO [facility]

## 2023-06-28 ENCOUNTER — Ambulatory Visit
Admission: RE | Admit: 2023-06-28 | Discharge: 2023-06-28 | Disposition: A | Discharge status: Home / Self Care | Payer: Managed Care, Other (non HMO) | Source: Ambulatory Visit | Attending: Obstetrics | Admitting: Obstetrics

## 2023-06-28 DIAGNOSIS — N6323 Unspecified lump in the left breast, lower outer quadrant: Secondary | ICD-10-CM | POA: Insufficient documentation

## 2023-06-28 DIAGNOSIS — N644 Mastodynia: Secondary | ICD-10-CM | POA: Diagnosis present

## 2023-06-28 DIAGNOSIS — G8929 Other chronic pain: Secondary | ICD-10-CM | POA: Insufficient documentation

## 2023-08-10 ENCOUNTER — Encounter: Payer: Self-pay | Admitting: Nurse Practitioner

## 2023-08-16 ENCOUNTER — Ambulatory Visit (INDEPENDENT_AMBULATORY_CARE_PROVIDER_SITE_OTHER): Admitting: Nurse Practitioner

## 2023-08-16 VITALS — BP 110/64 | Temp 98.7°F | Ht 68.0 in | Wt 125.0 lb

## 2023-08-16 DIAGNOSIS — K649 Unspecified hemorrhoids: Secondary | ICD-10-CM | POA: Insufficient documentation

## 2023-08-16 DIAGNOSIS — K581 Irritable bowel syndrome with constipation: Secondary | ICD-10-CM | POA: Diagnosis not present

## 2023-08-16 MED ORDER — HYDROCORTISONE (PERIANAL) 2.5 % EX CREA: 1.0000 | TOPICAL_CREAM | Freq: Three times a day (TID) | CUTANEOUS | 2 refills | Status: AC | PRN

## 2023-08-16 NOTE — Assessment & Plan Note (Signed)
 IBS presents with alternating constipation and diarrhea, worsening during her menstrual cycle. Reports Hx of ulcerative colitis. No current management. She has inadequate fiber and fluid intake. Refer to GI for IBS and ulcerative colitis management. Encourage increased fiber intake and hydration.

## 2023-08-16 NOTE — Progress Notes (Signed)
 Bluford Burkitt, NP-C Phone: [phone]  [name] is a 33 y.o. female who presents today for hemorrhoids.  Discussed the use of AI scribe software for clinical note transcription with the patient, who gave verbal consent to proceed.  History of Present Illness   Brianna Taflinger "De'Rika" is a 33 year old female with IBS and ulcerative colitis who presents with concerns about hemorrhoids and possible anal fissure.  She was diagnosed with IBS and ulcerative colitis in 2019 following a colonoscopy in [place] . Since moving states, she has not followed up with proper protocol for managing these conditions.  In January 2025, she began experiencing symptoms suggestive of hemorrhoids and possibly an anal fissure. She describes noticing what appeared to be a cut in the area. The symptoms flare during her menstrual cycle, causing pain and discomfort. She experiences alternating constipation and diarrhea, with bowel movements becoming softer and more diarrhea-like during her menstrual cycle. No straining during bowel movements, but she acknowledges the possibility of doing so. She has used over-the-counter treatments including Tux wipes with witch hazel and a cream containing lidocaine, which provide some relief during flare-ups.  She is unsure about bleeding due to the overlap with her menstrual cycle but has not noticed bleeding when using a tampon. No fever or chills. She acknowledges that her fiber intake could be improved and that she could drink more water.      Social History   Tobacco Use  Smoking Status Every Day   Current packs/day: 0.50   Average packs/day: 0.5 packs/day for 10.0 years (5.0 ttl pk-yrs)   Types: Cigarettes, Cigars  Smokeless Tobacco Never  Tobacco Comments   1 black and mild per day- wanting to quit. Counseling provided.     Current Outpatient Medications on File Prior to Visit  Medication Sig Dispense Refill   norethindrone -ethinyl estradiol (MICROGESTIN)  1-20 MG-MCG tablet Take 1 tablet by mouth daily. 84 tablet 3   sertraline  (ZOLOFT ) 25 MG tablet TAKE 1 TABLET(25 MG) BY MOUTH DAILY 90 tablet 0   Vitamin D , Ergocalciferol , (DRISDOL ) 1.25 MG (50000 UNIT) CAPS capsule Take 1 capsule (50,000 Units total) by mouth every 7 (seven) days. 13 capsule 1   No current facility-administered medications on file prior to visit.     ROS see history of present illness  Objective  Physical Exam Vitals:   08/16/23 1339  BP: 110/64  Temp: 98.7 F (37.1 C)    BP Readings from Last 3 Encounters:  08/16/23 110/64  06/20/23 110/78  05/27/23 (!) 130/91   Wt Readings from Last 3 Encounters:  08/16/23 125 lb (56.7 kg)  06/20/23 125 lb (56.7 kg)  05/27/23 125 lb (56.7 kg)    Physical Exam Exam conducted with a chaperone present Jackline Maser, CMA).  Constitutional:      General: She is not in acute distress.    Appearance: Normal appearance.  HENT:     Head: Normocephalic.  Cardiovascular:     Rate and Rhythm: Normal rate and regular rhythm.     Heart sounds: Normal heart sounds.  Pulmonary:     Effort: Pulmonary effort is normal.     Breath sounds: Normal breath sounds.  Genitourinary:    Rectum: External hemorrhoid present. No tenderness or anal fissure.     Comments: Small, soft hemorrhoid noted. No tenderness or bleeding present. Not thrombosed.  Skin:    General: Skin is warm and dry.  Neurological:     General: No focal deficit present.  Mental Status: She is alert.  Psychiatric:        Mood and Affect: Mood normal.        Behavior: Behavior normal.     Assessment/Plan: Please see individual problem list.  Hemorrhoids, unspecified hemorrhoid type Assessment & Plan: Possible hemorrhoids flare up during her menstrual cycle, relieved by Tux wipes and lidocaine cream. There is no thrombosis or need for surgical intervention. Prescribe hydrocortisone  cream for anal application. Refer to GI for further evaluation. Encourage  increased fiber intake and stool softeners as needed.   Orders: -     Hydrocortisone  (Perianal); Place 1 Application rectally 3 (three) times daily as needed for hemorrhoids or anal itching.  Dispense: 30 g; Refill: 2 -     Ambulatory referral to Gastroenterology  Irritable bowel syndrome with constipation Assessment & Plan: IBS presents with alternating constipation and diarrhea, worsening during her menstrual cycle. Reports Hx of ulcerative colitis. No current management. She has inadequate fiber and fluid intake. Refer to GI for IBS and ulcerative colitis management. Encourage increased fiber intake and hydration.  Orders: -     Ambulatory referral to Gastroenterology   Return if symptoms worsen or fail to improve.   Bluford Burkitt, NP-C [facility]

## 2023-08-16 NOTE — Assessment & Plan Note (Signed)
 Possible hemorrhoids flare up during her menstrual cycle, relieved by Tux wipes and lidocaine cream. There is no thrombosis or need for surgical intervention. Prescribe hydrocortisone  cream for anal application. Refer to GI for further evaluation. Encourage increased fiber intake and stool softeners as needed.

## 2023-10-25 ENCOUNTER — Encounter: Payer: Self-pay | Admitting: Nurse Practitioner

## 2023-11-18 ENCOUNTER — Ambulatory Visit: Attending: Nurse Practitioner

## 2023-11-18 ENCOUNTER — Ambulatory Visit: Admitting: Nurse Practitioner

## 2023-11-18 VITALS — BP 122/70 | Temp 99.1°F | Ht 68.0 in | Wt 125.8 lb

## 2023-11-18 DIAGNOSIS — R002 Palpitations: Secondary | ICD-10-CM

## 2023-11-18 DIAGNOSIS — F419 Anxiety disorder, unspecified: Secondary | ICD-10-CM

## 2023-11-18 DIAGNOSIS — Z0001 Encounter for general adult medical examination with abnormal findings: Secondary | ICD-10-CM

## 2023-11-18 DIAGNOSIS — E559 Vitamin D deficiency, unspecified: Secondary | ICD-10-CM

## 2023-11-18 DIAGNOSIS — F32A Depression, unspecified: Secondary | ICD-10-CM

## 2023-11-18 DIAGNOSIS — N920 Excessive and frequent menstruation with regular cycle: Secondary | ICD-10-CM | POA: Diagnosis not present

## 2023-11-18 DIAGNOSIS — Z13220 Encounter for screening for lipoid disorders: Secondary | ICD-10-CM

## 2023-11-18 DIAGNOSIS — Z3041 Encounter for surveillance of contraceptive pills: Secondary | ICD-10-CM

## 2023-11-18 LAB — COMPREHENSIVE METABOLIC PANEL WITH GFR
ALT: 12 U/L (ref 0–35)
AST: 17 U/L (ref 0–37)
Albumin: 4.1 g/dL (ref 3.5–5.2)
Alkaline Phosphatase: 41 U/L (ref 39–117)
BUN: 6 mg/dL (ref 6–23)
CO2: 28 meq/L (ref 19–32)
Calcium: 9.2 mg/dL (ref 8.4–10.5)
Chloride: 104 meq/L (ref 96–112)
Creatinine, Ser: 0.77 mg/dL (ref 0.40–1.20)
GFR: 102.52 mL/min (ref >60.00)
Glucose, Bld: 79 mg/dL (ref 70–99)
Potassium: 3.7 meq/L (ref 3.5–5.1)
Sodium: 138 meq/L (ref 135–145)
Total Bilirubin: 0.4 mg/dL (ref 0.2–1.2)
Total Protein: 7 g/dL (ref 6.0–8.3)

## 2023-11-18 LAB — CBC WITH DIFFERENTIAL/PLATELET
Basophils Absolute: 0 K/uL (ref 0.0–0.1)
Basophils Relative: 0.5 % (ref 0.0–3.0)
Eosinophils Absolute: 0.1 K/uL (ref 0.0–0.7)
Eosinophils Relative: 0.9 % (ref 0.0–5.0)
HCT: 35.8 % — ABNORMAL LOW (ref 36.0–46.0)
Hemoglobin: 11.8 g/dL — ABNORMAL LOW (ref 12.0–15.0)
Lymphocytes Relative: 39.4 % (ref 12.0–46.0)
Lymphs Abs: 3.4 K/uL (ref 0.7–4.0)
MCHC: 33 g/dL (ref 30.0–36.0)
MCV: 92.5 fl (ref 78.0–100.0)
Monocytes Absolute: 0.5 K/uL (ref 0.1–1.0)
Monocytes Relative: 5.5 % (ref 3.0–12.0)
Neutro Abs: 4.6 K/uL (ref 1.4–7.7)
Neutrophils Relative %: 53.7 % (ref 43.0–77.0)
Platelets: 261 K/uL (ref 150.0–400.0)
RBC: 3.87 Mil/uL (ref 3.87–5.11)
RDW: 13.2 % (ref 11.5–15.5)
WBC: 8.5 K/uL (ref 4.0–10.5)

## 2023-11-18 LAB — VITAMIN D 25 HYDROXY (VIT D DEFICIENCY, FRACTURES): VITD: 18.04 ng/mL — ABNORMAL LOW (ref 30.00–100.00)

## 2023-11-18 LAB — LIPID PANEL
Cholesterol: 172 mg/dL (ref 0–200)
HDL: 38.3 mg/dL — ABNORMAL LOW (ref >39.00)
LDL Cholesterol: 115 mg/dL — ABNORMAL HIGH (ref 0–99)
NonHDL: 133.22
Total CHOL/HDL Ratio: 4
Triglycerides: 90 mg/dL (ref 0.0–149.0)
VLDL: 18 mg/dL (ref 0.0–40.0)

## 2023-11-18 LAB — TSH: TSH: 0.97 u[IU]/mL (ref 0.35–5.50)

## 2023-11-18 MED ORDER — NORETHINDRONE ACET-ETHINYL EST 1-20 MG-MCG PO TABS: 1.0000 | ORAL_TABLET | Freq: Every day | ORAL | 3 refills | Status: DC

## 2023-11-18 MED ORDER — SERTRALINE HCL 25 MG PO TABS: 25.0000 mg | ORAL_TABLET | Freq: Every day | ORAL | 3 refills | Status: DC

## 2023-11-18 NOTE — Progress Notes (Signed)
 Leron Glance, NP-C Phone: [phone]  [name] is a 33 y.o. female who presents today for annual exam.   Discussed the use of AI scribe software for clinical note transcription with the patient, who gave verbal consent to proceed.  History of Present Illness   [name] is a 33 year old female who presents for annual exam with heart palpitations.  She experiences heart palpitations described as flutters occurring randomly throughout the day, lasting from a few seconds to five minutes. These episodes are sometimes associated with chest tightness and pain localized to the left side, without radiation to the arm or shortness of breath. No specific triggers have been identified, but she acknowledges increased stress levels recently.  She quit consuming Red Bull three months ago and has noticed the onset of palpitations since then. Her tobacco use has reduced from three cigarettes a day to one and a half. She consumes alcohol occasionally, about once every two weeks, and denies any drug use.  Her menstrual periods have improved since starting birth control, now lasting three to four days with less heaviness. She has a history of mastitis and is awaiting a follow-up mammogram due to previous findings of dense breast tissue, though no abnormalities were initially detected.  She experiences occasional headaches but denies dizziness, trouble swallowing, or gastrointestinal symptoms. She mentions that her skin does not heal as it used to, attributing this to aging. 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  She has a history of anxiety and depression, previously managed with Zoloft  25 mg, which she had stopped taking regularly.      Social History   Tobacco Use  Smoking Status Every Day   Current packs/day: 0.50   Average packs/day: 0.5  packs/day for 10.0 years (5.0 ttl pk-yrs)   Types: Cigarettes, Cigars  Smokeless Tobacco Never  Tobacco Comments   1 black and mild per day- wanting to quit. Counseling provided.     Current Outpatient Medications on File Prior to Visit  Medication Sig Dispense Refill   hydrocortisone  (ANUSOL -HC) 2.5 % rectal cream Place 1 Application rectally 3 (three) times daily as needed for hemorrhoids or anal itching. 30 g 2   No current facility-administered medications on file prior to visit.     ROS see history of present illness  Objective  Physical Exam Vitals:   11/18/23 1305  BP: 122/70  Temp: 99.1 F (37.3 C)    BP Readings from Last 3 Encounters:  11/18/23 122/70  08/16/23 110/64  06/20/23 110/78   Wt Readings from Last 3 Encounters:  11/18/23 125 lb 12.8 oz (57.1 kg)  08/16/23 125 lb (56.7 kg)  06/20/23 125 lb (56.7 kg)    Physical Exam Constitutional:      General: She is not in acute distress.    Appearance: Normal appearance.  HENT:     Head: Normocephalic.     Right Ear: Tympanic membrane normal.     Left Ear: Tympanic membrane normal.     Nose: Nose normal.     Mouth/Throat:     Mouth: Mucous membranes are moist.     Pharynx: Oropharynx is clear.  Eyes:     Conjunctiva/sclera: Conjunctivae normal.     Pupils: Pupils are equal, round, and reactive to light.  Neck:     Thyroid: No thyromegaly.  Cardiovascular:     Rate and  Rhythm: Normal rate and regular rhythm.     Heart sounds: Normal heart sounds.  Pulmonary:     Effort: Pulmonary effort is normal.     Breath sounds: Normal breath sounds.  Abdominal:     General: Abdomen is flat. Bowel sounds are normal.     Palpations: Abdomen is soft. There is no mass.     Tenderness: There is no abdominal tenderness.  Musculoskeletal:        General: Normal range of motion.  Lymphadenopathy:     Cervical: No cervical adenopathy.  Skin:    General: Skin is warm and dry.     Findings: No rash.   Neurological:     General: No focal deficit present.     Mental Status: She is alert.  Psychiatric:        Mood and Affect: Mood normal.        Behavior: Behavior normal.      Assessment/Plan: Please see individual problem list.  Encounter for routine adult health examination with abnormal findings Assessment & Plan: Physical exam complete. We will check lab work as outlined. She is up to date on her Pap smear and tetanus vaccine. Declines flu and additional COVID vaccines. Tobacco use has been reduced, counseled on tobacco cessation. Encourage routine dental and eye exams. Encourage healthy diet and regular exercise. Return to care in one year, sooner as needed.   Orders: -     CBC with Differential/Platelet -     Comprehensive metabolic panel with GFR  Palpitations Assessment & Plan: She experiences random palpitations, possibly due to stress, or caffeine. She has stopped consuming Red Bull. A two-week home heart monitor is ordered to evaluate for arrhythmias. Results will be reviewed with cardiology, and follow-up will be based on findings.  Orders: -     TSH -     LONG TERM MONITOR (3-14 DAYS); Future  Anxiety and depression Assessment & Plan: Her anxiety is exacerbated by stress, and previous as-needed Zoloft  use was ineffective. She plans to resume Zoloft  25 mg daily. Counseled on importance of taking medication daily. Her mood and anxiety levels will be monitored for changes. Counseled patient on common side effects. Encouraged to contact if worsening symptoms, unusual behavior changes or suicidal thoughts occur.   Orders: -     Sertraline  HCl; Take 1 tablet (25 mg total) by mouth at bedtime.  Dispense: 90 tablet; Refill: 3  Vitamin D  deficiency -     VITAMIN D  25 Hydroxy (Vit-D Deficiency, Fractures)  Menorrhagia with regular cycle -     Norethindrone  Acet-Ethinyl Est; Take 1 tablet by mouth daily.  Dispense: 84 tablet; Refill: 3  Surveillance for birth control,  oral contraceptives -     Norethindrone  Acet-Ethinyl Est; Take 1 tablet by mouth daily.  Dispense: 84 tablet; Refill: 3  Lipid screening -     Lipid panel     Return in about 1 year (around 11/17/2024) for Annual Exam, sooner as needed.   Leron Glance, NP-C  Primary [facility] - [facility]

## 2023-11-23 ENCOUNTER — Ambulatory Visit: Payer: Self-pay | Admitting: Nurse Practitioner

## 2023-11-24 ENCOUNTER — Encounter: Admitting: Nurse Practitioner

## 2023-11-25 ENCOUNTER — Other Ambulatory Visit: Payer: Self-pay | Admitting: Nurse Practitioner

## 2023-11-25 DIAGNOSIS — D509 Iron deficiency anemia, unspecified: Secondary | ICD-10-CM

## 2023-11-25 DIAGNOSIS — E559 Vitamin D deficiency, unspecified: Secondary | ICD-10-CM

## 2023-11-25 MED ORDER — VITAMIN D (ERGOCALCIFEROL) 1.25 MG (50000 UNIT) PO CAPS: 50000.0000 [IU] | ORAL_CAPSULE | ORAL | 1 refills | Status: AC

## 2023-11-25 MED ORDER — IRON (FERROUS SULFATE) 325 (65 FE) MG PO TABS: 1.0000 | ORAL_TABLET | Freq: Every day | ORAL | 1 refills | Status: AC

## 2023-11-30 ENCOUNTER — Encounter: Payer: Self-pay | Admitting: Nurse Practitioner

## 2023-11-30 NOTE — Assessment & Plan Note (Signed)
 She experiences random palpitations, possibly due to stress, or caffeine. She has stopped consuming Red Bull. A two-week home heart monitor is ordered to evaluate for arrhythmias. Results will be reviewed with cardiology, and follow-up will be based on findings.

## 2023-11-30 NOTE — Assessment & Plan Note (Signed)
 Physical exam complete. We will check lab work as outlined. She is up to date on her Pap smear and tetanus vaccine. Declines flu and additional COVID vaccines. Tobacco use has been reduced, counseled on tobacco cessation. Encourage routine dental and eye exams. Encourage healthy diet and regular exercise. Return to care in one year, sooner as needed.

## 2023-11-30 NOTE — Assessment & Plan Note (Signed)
 Her anxiety is exacerbated by stress, and previous as-needed Zoloft  use was ineffective. She plans to resume Zoloft  25 mg daily. Counseled on importance of taking medication daily. Her mood and anxiety levels will be monitored for changes. Counseled patient on common side effects. Encouraged to contact if worsening symptoms, unusual behavior changes or suicidal thoughts occur.

## 2023-12-14 DIAGNOSIS — R002 Palpitations: Secondary | ICD-10-CM

## 2023-12-28 ENCOUNTER — Telehealth (INDEPENDENT_AMBULATORY_CARE_PROVIDER_SITE_OTHER): Admitting: Nurse Practitioner

## 2023-12-28 VITALS — Ht 68.0 in | Wt 125.0 lb

## 2023-12-28 DIAGNOSIS — F419 Anxiety disorder, unspecified: Secondary | ICD-10-CM | POA: Diagnosis not present

## 2023-12-28 DIAGNOSIS — R002 Palpitations: Secondary | ICD-10-CM

## 2023-12-28 DIAGNOSIS — F32A Depression, unspecified: Secondary | ICD-10-CM

## 2023-12-28 DIAGNOSIS — D509 Iron deficiency anemia, unspecified: Secondary | ICD-10-CM

## 2023-12-28 DIAGNOSIS — G479 Sleep disorder, unspecified: Secondary | ICD-10-CM | POA: Diagnosis not present

## 2023-12-28 DIAGNOSIS — E559 Vitamin D deficiency, unspecified: Secondary | ICD-10-CM

## 2023-12-28 NOTE — Progress Notes (Signed)
 MyChart Video Visit    Virtual Visit via Video Note   This visit type was conducted because this format is felt to be most appropriate for this patient at this time. Physical exam was limited by quality of the video and audio technology used for the visit. CMA was able to get the patient set up on a video visit.  Patient location: Outside work. Patient and provider in visit Provider location: Office  I discussed the limitations of evaluation and management by telemedicine and the availability of in person appointments. The patient expressed understanding and agreed to proceed.  Visit Date: 12/28/2023  Today's healthcare provider: Leron Glance, NP     Subjective:    Patient ID: [name], female    DOB: July 03, 1990, 33 y.o.   MRN: [id number]  No chief complaint on file.   HPI  Discussed the use of AI scribe software for clinical note transcription with the patient, who gave verbal consent to proceed.  History of Present Illness   [name] is a 33 year old female who presents with concerns about heart palpitations and recent lab results.  She experiences heart palpitations described as 'little flutters' in her chest, sometimes accompanied by sharp or dull pressure. These symptoms became more noticeable after she stopped consuming Red Bull, although she continues to drink coffee.  She has a history of heavy menstrual periods and hemorrhoids. Recent lab work showed a hemoglobin level of 11.8, slightly below the normal range, and low vitamin D  levels. She has been started on a weekly vitamin D  supplement.  She struggles with sleep, having difficulty falling asleep and maintaining a consistent sleep pattern. She often feels ready for bed after work but wakes up in the early hours if she sleeps too early.  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      Past Medical History:  Diagnosis  Date   Anxiety 2018   Asthma    Depression    Heart murmur    I was a teenager. Cannot remember the date   History of fainting spells of unknown cause     Past Surgical History:  Procedure Laterality Date   APPENDECTOMY  2014   COLON SURGERY  2014    Family History  Problem Relation Age of Onset   Hypertension Mother    COPD Mother    COPD Maternal Grandmother    Alcohol abuse Maternal Grandfather    COPD Maternal Grandfather    COPD Paternal Grandfather    Breast cancer Neg Hx     Social History   Socioeconomic History   Marital status: Single    Spouse name: Not on file   Number of children: Not on file   Years of education: Not on file   Highest education level: Bachelor's degree (e.g., BA, AB, BS)  Occupational History   Not on file  Tobacco Use   Smoking status: Every Day    Current packs/day: 0.50    Average packs/day: 0.5 packs/day for 10.0 years (5.0 ttl pk-yrs)    Types: Cigarettes, Cigars   Smokeless tobacco: Never   Tobacco comments:    1 black and mild per day- wanting to quit. Counseling provided.   Vaping Use   Vaping status: Never Used  Substance and Sexual Activity   Alcohol use: Yes    Comment: social    Drug use: Never   Sexual activity:  Yes    Birth control/protection: Condom, Pill  Other Topics Concern   Not on file  Social History Narrative   Not on file   Social Drivers of Health   Financial Resource Strain: Medium Risk (11/18/2023)   Overall Financial Resource Strain (CARDIA)    Difficulty of Paying Living Expenses: Somewhat hard  Food Insecurity: Food Insecurity Present (11/18/2023)   Hunger Vital Sign    Worried About Running Out of Food in the Last Year: Sometimes true    Ran Out of Food in the Last Year: Never true  Transportation Needs: No Transportation Needs (11/18/2023)   PRAPARE - Administrator, Civil Service (Medical): No    Lack of Transportation (Non-Medical): No  Physical Activity: Inactive (11/18/2023)    Exercise Vital Sign    Days of Exercise per Week: 5 days    Minutes of Exercise per Session: 0 min  Stress: Stress Concern Present (11/18/2023)   [employer] of Occupational Health - Occupational Stress Questionnaire    Feeling of Stress: Rather much  Social Connections: Moderately Isolated (11/18/2023)   Social Connection and Isolation Panel    Frequency of Communication with Friends and Family: Twice a week    Frequency of Social Gatherings with Friends and Family: Once a week    Attends Religious Services: 1 to 4 times per year    Active Member of [employer] or Organizations: No    Attends Engineer, structural: Not on file    Marital Status: Never married  Intimate Partner Violence: Not on file    Outpatient Medications Prior to Visit  Medication Sig Dispense Refill   hydrocortisone  (ANUSOL -HC) 2.5 % rectal cream Place 1 Application rectally 3 (three) times daily as needed for hemorrhoids or anal itching. 30 g 2   norethindrone -ethinyl estradiol (MICROGESTIN) 1-20 MG-MCG tablet Take 1 tablet by mouth daily. 84 tablet 3   sertraline  (ZOLOFT ) 25 MG tablet Take 1 tablet (25 mg total) by mouth at bedtime. 90 tablet 3   Vitamin D , Ergocalciferol , (DRISDOL ) 1.25 MG (50000 UNIT) CAPS capsule Take 1 capsule (50,000 Units total) by mouth every 7 (seven) days. 13 capsule 1   Iron , Ferrous Sulfate , 325 (65 Fe) MG TABS Take 1 tablet by mouth daily. (Patient not taking: Reported on 12/28/2023) 90 tablet 1   No facility-administered medications prior to visit.    No Known Allergies  ROS See HPI    Objective:    Physical Exam  Ht 5' 8 (1.727 m)   Wt 125 lb (56.7 kg)   BMI 19.01 kg/m  Wt Readings from Last 3 Encounters:  12/28/23 125 lb (56.7 kg)  11/18/23 125 lb 12.8 oz (57.1 kg)  08/16/23 125 lb (56.7 kg)   GENERAL: alert, oriented, appears well and in no acute distress   HEENT: atraumatic, conjunttiva clear, no obvious abnormalities on inspection of external nose and  ears   NECK: normal movements of the head and neck   LUNGS: on inspection no signs of respiratory distress, breathing rate appears normal, no obvious gross SOB, gasping or wheezing   CV: no obvious cyanosis   MS: moves all visible extremities without noticeable abnormality   PSYCH/NEURO: pleasant and cooperative, no obvious depression or anxiety, speech and thought processing grossly intact    Assessment & Plan:   Problem List Items Addressed This Visit     Anxiety and depression   Currently on Zoloft  25 mg daily. 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 Open to increasing Zoloft  dosage. Consider increasing Zoloft  dosage if stress and anxiety levels warrant it. Re-evaluate in one month to assess management effectiveness. Continue Zoloft  25 mg daily.       Vitamin D  deficiency   Continue weekly vitamin D  supplementation.       Palpitations   Rare supraventricular and ventricular ectopy noted on recent Zio monitor without sustained arrhythmias or AFib. Symptoms include palpitations, chest flutters, and occasional chest pain. Potential contributing factors include low iron , sleep issues, caffeine, stress, and anxiety. Monitor and reduce caffeine intake as needed. Improve sleep hygiene. Re-evaluate in one month with follow-up labs. Advise hospital visit for severe chest pain, pressure, dyspnea, or radiating pain.      Iron  deficiency anemia - Primary   Mild anemia with hemoglobin at 11.8 is likely due to menorrhagia and may contribute to palpitations, fatigue, and dyspnea. Start oral daily iron  supplement. Recheck labs in one month, including a full iron  panel with ferritin. Consider hematology referral for iron  infusions if oral iron  is not tolerated or ineffective. Advise taking docusate to prevent constipation and discuss alternative iron  supplements if constipation occurs.      Sleeping difficulty   She has difficulty initiating sleep with an irregular pattern,  potentially contributing to palpitations. Inconsistent schedule due to work hours. Implement sleep hygiene practices, including a consistent bedtime routine and reducing screen time before bed. We will continue to monitor.        I am having [name] Strickland maintain her hydrocortisone , norethindrone -ethinyl estradiol, sertraline , Vitamin D  (Ergocalciferol ), and Iron  (Ferrous Sulfate ).  No orders of the defined types were placed in this encounter.   I discussed the assessment and treatment plan with the patient. The patient was provided an opportunity to ask questions and all were answered. The patient agreed with the plan and demonstrated an understanding of the instructions.   The patient was advised to call back or seek an in-person evaluation if the symptoms worsen or if the condition fails to improve as anticipated.   Leron Glance, NP [facility] at [facility] [phone] (phone) [phone] (fax)  [facility]

## 2024-01-05 ENCOUNTER — Ambulatory Visit (INDEPENDENT_AMBULATORY_CARE_PROVIDER_SITE_OTHER): Payer: Self-pay | Admitting: Podiatry

## 2024-01-05 DIAGNOSIS — M21962 Unspecified acquired deformity of left lower leg: Secondary | ICD-10-CM | POA: Diagnosis not present

## 2024-01-05 DIAGNOSIS — M2011 Hallux valgus (acquired), right foot: Secondary | ICD-10-CM

## 2024-01-05 DIAGNOSIS — M21961 Unspecified acquired deformity of right lower leg: Secondary | ICD-10-CM

## 2024-01-05 DIAGNOSIS — M2012 Hallux valgus (acquired), left foot: Secondary | ICD-10-CM | POA: Diagnosis not present

## 2024-01-05 NOTE — Progress Notes (Signed)
 Subjective:  Patient ID: [name], female    DOB: January 22, 1991,  MRN: [id number]  Chief Complaint  Patient presents with   Flat Foot    Pt stated that she has flat feet and they have been causing her pain she stated that she gets a lot of throbbing pains in her big toes and she just wanted to come in and see what could be done if anything to help her     33 y.o. female presents with the above complaint.  Patient presents with bilateral moderate bunion deformities, causing her some discomfort and some throbbing sensations.  She wanted get it evaluated she wanted to discuss treatment options for it.  She mostly wears easy Spirit shoes.  She also wanted to discuss orthotics option as well.  Denies any other acute complaints.   Review of Systems: Negative except as noted in the HPI. Denies N/V/F/Ch.  Past Medical History:  Diagnosis Date   Anxiety 2018   Asthma    Depression    Heart murmur    I was a teenager. Cannot remember the date   History of fainting spells of unknown cause     Current Outpatient Medications:    hydrocortisone  (ANUSOL -HC) 2.5 % rectal cream, Place 1 Application rectally 3 (three) times daily as needed for hemorrhoids or anal itching., Disp: 30 g, Rfl: 2   Iron , Ferrous Sulfate , 325 (65 Fe) MG TABS, Take 1 tablet by mouth daily. (Patient not taking: Reported on 12/28/2023), Disp: 90 tablet, Rfl: 1   norethindrone -ethinyl estradiol (MICROGESTIN) 1-20 MG-MCG tablet, Take 1 tablet by mouth daily., Disp: 84 tablet, Rfl: 3   sertraline  (ZOLOFT ) 25 MG tablet, Take 1 tablet (25 mg total) by mouth at bedtime., Disp: 90 tablet, Rfl: 3   Vitamin D , Ergocalciferol , (DRISDOL ) 1.25 MG (50000 UNIT) CAPS capsule, Take 1 capsule (50,000 Units total) by mouth every 7 (seven) days., Disp: 13 capsule, Rfl: 1  Social History   Tobacco Use  Smoking Status Every Day   Current packs/day: 0.50   Average packs/day: 0.5 packs/day for 10.0 years (5.0 ttl pk-yrs)   Types: Cigarettes,  Cigars  Smokeless Tobacco Never  Tobacco Comments   1 black and mild per day- wanting to quit. Counseling provided.     No Known Allergies Objective:  There were no vitals filed for this visit. There is no height or weight on file to calculate BMI. Constitutional Well developed. Well nourished.  Vascular Dorsalis pedis pulses palpable bilaterally. Posterior tibial pulses palpable bilaterally. Capillary refill normal to all digits.  No cyanosis or clubbing noted. Pedal hair growth normal.  Neurologic Normal speech. Oriented to person, place, and time. Epicritic sensation to light touch grossly present bilaterally.  Dermatologic Nails well groomed and normal in appearance. No open wounds. No skin lesions.  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   Radiographs: None Assessment:   1. Foot deformity, bilateral   2. Valgus deformity of both great toes    Plan:  Patient was evaluated and treated and all questions answered.  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  Given patient foot structure in the setting of Planter fasciitis I believe patient will benefit from custom-made orthotics to help control the hindfoot motion support the arch of the foot and take the  stress away from plantar fascial.  Patient agrees with the plan like to proceed with orthotics -Patient was casted for orthotics   No follow-ups on file.

## 2024-01-11 ENCOUNTER — Encounter: Payer: Self-pay | Admitting: Nurse Practitioner

## 2024-01-11 DIAGNOSIS — G479 Sleep disorder, unspecified: Secondary | ICD-10-CM | POA: Insufficient documentation

## 2024-01-11 DIAGNOSIS — D509 Iron deficiency anemia, unspecified: Secondary | ICD-10-CM | POA: Insufficient documentation

## 2024-01-11 NOTE — Assessment & Plan Note (Signed)
 She has difficulty initiating sleep with an irregular pattern, potentially contributing to palpitations. Inconsistent schedule due to work hours. Implement sleep hygiene practices, including a consistent bedtime routine and reducing screen time before bed. We will continue to monitor.

## 2024-01-11 NOTE — Assessment & Plan Note (Addendum)
 Rare supraventricular and ventricular ectopy noted on recent Zio monitor without sustained arrhythmias or AFib. Symptoms include palpitations, chest flutters, and occasional chest pain. Potential contributing factors include low iron , sleep issues, caffeine, stress, and anxiety. Monitor and reduce caffeine intake as needed. Improve sleep hygiene. Re-evaluate in one month with follow-up labs. Advise hospital visit for severe chest pain, pressure, dyspnea, or radiating pain.

## 2024-01-11 NOTE — Assessment & Plan Note (Signed)
 Continue weekly vitamin D supplementation.

## 2024-01-11 NOTE — Assessment & Plan Note (Signed)
 Currently on Zoloft  25 mg daily. 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 Open to increasing Zoloft  dosage. Consider increasing Zoloft  dosage if stress and anxiety levels warrant it. Re-evaluate in one month to assess management effectiveness. Continue Zoloft  25 mg daily.

## 2024-01-11 NOTE — Assessment & Plan Note (Signed)
 Mild anemia with hemoglobin at 11.8 is likely due to menorrhagia and may contribute to palpitations, fatigue, and dyspnea. Start oral daily iron  supplement. Recheck labs in one month, including a full iron  panel with ferritin. Consider hematology referral for iron  infusions if oral iron  is not tolerated or ineffective. Advise taking docusate to prevent constipation and discuss alternative iron  supplements if constipation occurs.

## 2024-01-31 ENCOUNTER — Telehealth: Payer: Self-pay

## 2024-01-31 NOTE — Telephone Encounter (Signed)
 LVM to schedule orthotic fitting/ pu  Orthotics in [place] bin

## 2024-01-31 NOTE — Telephone Encounter (Signed)
 Orthotics are here Balance $81.30 Financial form signed and on file  Appointment needed

## 2024-02-08 ENCOUNTER — Telehealth: Payer: Self-pay

## 2024-02-08 ENCOUNTER — Ambulatory Visit (INDEPENDENT_AMBULATORY_CARE_PROVIDER_SITE_OTHER): Admitting: Nurse Practitioner

## 2024-02-08 VITALS — BP 106/60 | HR 82 | Temp 99.1°F | Ht 68.0 in | Wt 122.6 lb

## 2024-02-08 DIAGNOSIS — F32A Depression, unspecified: Secondary | ICD-10-CM

## 2024-02-08 DIAGNOSIS — D509 Iron deficiency anemia, unspecified: Secondary | ICD-10-CM | POA: Diagnosis not present

## 2024-02-08 DIAGNOSIS — F419 Anxiety disorder, unspecified: Secondary | ICD-10-CM | POA: Diagnosis not present

## 2024-02-08 DIAGNOSIS — N920 Excessive and frequent menstruation with regular cycle: Secondary | ICD-10-CM

## 2024-02-08 DIAGNOSIS — R002 Palpitations: Secondary | ICD-10-CM

## 2024-02-08 LAB — CBC WITH DIFFERENTIAL/PLATELET
Basophils Absolute: 0 K/uL (ref 0.0–0.1)
Basophils Relative: 0.7 % (ref 0.0–3.0)
Eosinophils Absolute: 0.1 K/uL (ref 0.0–0.7)
Eosinophils Relative: 0.8 % (ref 0.0–5.0)
HCT: 33.4 % — ABNORMAL LOW (ref 36.0–46.0)
Hemoglobin: 11.3 g/dL — ABNORMAL LOW (ref 12.0–15.0)
Lymphocytes Relative: 42.6 % (ref 12.0–46.0)
Lymphs Abs: 2.9 K/uL (ref 0.7–4.0)
MCHC: 33.7 g/dL (ref 30.0–36.0)
MCV: 92 fl (ref 78.0–100.0)
Monocytes Absolute: 0.3 K/uL (ref 0.1–1.0)
Monocytes Relative: 4.8 % (ref 3.0–12.0)
Neutro Abs: 3.5 K/uL (ref 1.4–7.7)
Neutrophils Relative %: 51.1 % (ref 43.0–77.0)
Platelets: 260 K/uL (ref 150.0–400.0)
RBC: 3.63 Mil/uL — ABNORMAL LOW (ref 3.87–5.11)
RDW: 13.1 % (ref 11.5–15.5)
WBC: 6.8 K/uL (ref 4.0–10.5)

## 2024-02-08 LAB — IBC + FERRITIN
Ferritin: 35 ng/mL (ref 10.0–291.0)
Iron: 115 ug/dL (ref 42–145)
Saturation Ratios: 41.9 % (ref 20.0–50.0)
TIBC: 274.4 ug/dL (ref 250.0–450.0)
Transferrin: 196 mg/dL — ABNORMAL LOW (ref 212.0–360.0)

## 2024-02-08 MED ORDER — SERTRALINE HCL 50 MG PO TABS: 50.0000 mg | ORAL_TABLET | Freq: Every day | ORAL | 0 refills | Status: DC

## 2024-02-08 NOTE — Telephone Encounter (Signed)
 Patient saw Leron Glance, FNP-C, today and Leron states in check-out note that she would like to see patient again in six weeks.  I scheduled an appointment for patient to see Leron again on 04/03/2024, which is her next available slot.  I let patient know that I will send a message to [facility] to let her know the date of her follow-up appointment.

## 2024-02-08 NOTE — Assessment & Plan Note (Signed)
 Managed with daily iron  supplementation, her menstrual bleeding has improved, likely due to adherence to birth control. Continue daily iron  supplementation. Check CBC and iron  panel.

## 2024-02-08 NOTE — Assessment & Plan Note (Signed)
 Managed with Zoloft  25 mg daily, some mood improvement is noted. She is open to increasing the dose to 50 mg for enhanced stabilization. Increase Zoloft  to 50 mg daily by taking two 25 mg tablets. Send prescription for 50 mg Zoloft . Advise switching Zoloft  to bedtime if increased drowsiness occurs. Counseled patient on common side effects. Encouraged to contact if worsening symptoms, unusual behavior changes or suicidal thoughts occur.

## 2024-02-08 NOTE — Assessment & Plan Note (Signed)
 Frequency has decreased, possibly due to reduced caffeine intake and improved iron  levels. We will continue to monitor.

## 2024-02-08 NOTE — Assessment & Plan Note (Signed)
 Improvement with adherence to birth control. Continue daily OCP. We will continue to monitor. Check CBC and iron  panel today.

## 2024-02-08 NOTE — Progress Notes (Signed)
 Leron Glance, NP-C Phone: [phone]  [name] is a 33 y.o. female who presents today for follow up.   Discussed the use of AI scribe software for clinical note transcription with the patient, who gave verbal consent to proceed.  History of Present Illness   [name] is a 33 year old female with iron  deficiency anemia and heavy menstrual periods who presents for a one-month follow-up.  She is currently taking an iron  supplement daily for her anemia, which has been effective in alleviating her symptoms. She does not experience significant constipation, although the supplement aids in stool formation. Her menstrual periods have become less heavy after correcting a mistake with her birth control regimen last month.  She experiences fatigue despite adequate sleep, which she attributes to her iron  deficiency. Heart palpitations have decreased in frequency.  She is being followed for stress and anxiety, managed with Zoloft . Her stress has improved with consistent use of Zoloft , though some stress persists. She feels that there continues to be room for improvement in her mood and stress. She is on a 25 mg dose of Zoloft  taken in the morning.      Social History   Tobacco Use  Smoking Status Every Day   Current packs/day: 0.50   Average packs/day: 0.5 packs/day for 10.0 years (5.0 ttl pk-yrs)   Types: Cigarettes, Cigars  Smokeless Tobacco Never  Tobacco Comments   1 black and mild per day- wanting to quit. Counseling provided.     Current Outpatient Medications on File Prior to Visit  Medication Sig Dispense Refill   hydrocortisone  (ANUSOL -HC) 2.5 % rectal cream Place 1 Application rectally 3 (three) times daily as needed for hemorrhoids or anal itching. 30 g 2   Iron , Ferrous Sulfate , 325 (65 Fe) MG TABS Take 1 tablet by mouth daily. 90 tablet 1   norethindrone -ethinyl estradiol (MICROGESTIN) 1-20 MG-MCG tablet Take 1 tablet by mouth daily. 84 tablet 3   Vitamin D ,  Ergocalciferol , (DRISDOL ) 1.25 MG (50000 UNIT) CAPS capsule Take 1 capsule (50,000 Units total) by mouth every 7 (seven) days. 13 capsule 1   No current facility-administered medications on file prior to visit.     ROS see history of present illness  Objective  Physical Exam Vitals:   02/08/24 1335  BP: 106/60  Pulse: 82  Temp: 99.1 F (37.3 C)  SpO2: 96%    BP Readings from Last 3 Encounters:  02/08/24 106/60  11/18/23 122/70  08/16/23 110/64   Wt Readings from Last 3 Encounters:  02/08/24 122 lb 9.6 oz (55.6 kg)  12/28/23 125 lb (56.7 kg)  11/18/23 125 lb 12.8 oz (57.1 kg)    Physical Exam Constitutional:      General: She is not in acute distress.    Appearance: Normal appearance.  HENT:     Head: Normocephalic.  Cardiovascular:     Rate and Rhythm: Normal rate and regular rhythm.     Heart sounds: Normal heart sounds.  Pulmonary:     Effort: Pulmonary effort is normal.     Breath sounds: Normal breath sounds.  Skin:    General: Skin is warm and dry.  Neurological:     General: No focal deficit present.     Mental Status: She is alert.  Psychiatric:        Mood and Affect: Mood normal.        Behavior: Behavior normal.      Assessment/Plan: Please see individual problem list.  Iron  deficiency anemia, unspecified  iron  deficiency anemia type Assessment & Plan: Managed with daily iron  supplementation, her menstrual bleeding has improved, likely due to adherence to birth control. Continue daily iron  supplementation. Check CBC and iron  panel.   Orders: -     IBC + Ferritin -     CBC with Differential/Platelet  Anxiety and depression Assessment & Plan: Managed with Zoloft  25 mg daily, some mood improvement is noted. She is open to increasing the dose to 50 mg for enhanced stabilization. Increase Zoloft  to 50 mg daily by taking two 25 mg tablets. Send prescription for 50 mg Zoloft . Advise switching Zoloft  to bedtime if increased drowsiness occurs.  Counseled patient on common side effects. Encouraged to contact if worsening symptoms, unusual behavior changes or suicidal thoughts occur.   Orders: -     Sertraline  HCl; Take 1 tablet (50 mg total) by mouth daily.  Dispense: 90 tablet; Refill: 0  Palpitations Assessment & Plan: Frequency has decreased, possibly due to reduced caffeine intake and improved iron  levels. We will continue to monitor.    Menorrhagia with regular cycle Assessment & Plan: Improvement with adherence to birth control. Continue daily OCP. We will continue to monitor. Check CBC and iron  panel today.        Return in about 6 weeks (around 03/21/2024) for Anxiety/Depression.   Leron Glance, NP-C [facility]

## 2024-02-20 ENCOUNTER — Ambulatory Visit: Admission: RE | Admit: 2024-02-20 | Discharge: 2024-02-20 | Disposition: A | Discharge status: Home / Self Care

## 2024-02-20 VITALS — BP 124/68 | HR 78 | Temp 98.2°F | Resp 18

## 2024-02-20 DIAGNOSIS — N644 Mastodynia: Secondary | ICD-10-CM | POA: Diagnosis not present

## 2024-02-20 HISTORY — DX: Anemia, unspecified: D64.9

## 2024-02-20 NOTE — Discharge Instructions (Signed)
 Go to [facility] for the mammogram and/or ultrasound of your breast on 02/22/2024 at 11:00 AM.    Schedule a follow-up appointment with your primary care provider.

## 2024-02-20 NOTE — ED Triage Notes (Signed)
 Patient to Urgent Care with complaints of left sided breast pain.    Symptoms x2 weeks. Has been dealing with intermittent pain since having mastitis last year.   Scheduled for mammogram in January.

## 2024-02-20 NOTE — ED Provider Notes (Signed)
 [facility]    CSN: [id number] Arrival date & time: 02/20/24  1350      History   Chief Complaint Chief Complaint  Patient presents with   Breast Problem    Pain and discomfort in left breast. - Entered by patient    HPI [name] is a 33 y.o. female.  Patient presents with 2-week history of left breast pain and mass.  She reports intermittent pain and mass in this same area since having mastitis in 2024.  No nipple discharge, skin changes, redness, fever, wounds.  Patient was seen by her OB/GYN on 06/20/2023 for chronic pain of the breast and a mass of lower outer quadrant of left breast; mammogram and ultrasound negative at that time.  Patient was seen at [facility] on 09/10/2022 for left mastitis and breast pain; treated with Augmentin and prednisone.  The history is provided by the patient and medical records.    Past Medical History:  Diagnosis Date   Anemia    Anxiety 2018   Asthma    Depression    Heart murmur    I was a teenager. Cannot remember the date   History of fainting spells of unknown cause     Patient Active Problem List   Diagnosis Date Noted   Iron  deficiency anemia 01/11/2024   Sleeping difficulty 01/11/2024   Palpitations 11/18/2023   Encounter for routine adult health examination with abnormal findings 11/18/2023   Irritable bowel syndrome with constipation 08/16/2023   Hemorrhoids 08/16/2023   Chronic pain of breast 06/20/2023   Mass of lower outer quadrant of left breast 06/20/2023   Vitamin D  deficiency 10/04/2022   Well woman exam with routine gynecological exam 10/01/2022   Menorrhagia with regular cycle 10/01/2022   Acute pain of left knee 08/27/2022   Anxiety and depression 08/27/2022   PTSD (post-traumatic stress disorder) 08/27/2022    Past Surgical History:  Procedure Laterality Date   APPENDECTOMY  2014   COLON SURGERY  2014    OB History     Gravida  0   Para  0   Term  0   Preterm  0   AB  0    Living  0      SAB  0   IAB  0   Ectopic  0   Multiple  0   Live Births  0            Home Medications    Prior to Admission medications   Medication Sig Start Date End Date Taking? Authorizing Provider  MILI 0.25-35 MG-MCG tablet Take 1 tablet by mouth daily. 02/15/24  Yes [provider]  hydrocortisone  (ANUSOL -HC) 2.5 % rectal cream Place 1 Application rectally 3 (three) times daily as needed for hemorrhoids or anal itching. 08/16/23   Gretel App, NP  Iron , Ferrous Sulfate , 325 (65 Fe) MG TABS Take 1 tablet by mouth daily. 11/25/23   Gretel App, NP  norethindrone -ethinyl estradiol (MICROGESTIN) 1-20 MG-MCG tablet Take 1 tablet by mouth daily. Patient not taking: Reported on 02/20/2024 11/18/23   Gretel App, NP  sertraline  (ZOLOFT ) 50 MG tablet Take 1 tablet (50 mg total) by mouth daily. 02/08/24   Gretel App, NP  Vitamin D , Ergocalciferol , (DRISDOL ) 1.25 MG (50000 UNIT) CAPS capsule Take 1 capsule (50,000 Units total) by mouth every 7 (seven) days. 11/25/23   Gretel App, NP    Family History Family History  Problem Relation Age of Onset   Hypertension Mother  COPD Mother    COPD Maternal Grandmother    Alcohol abuse Maternal Grandfather    COPD Maternal Grandfather    COPD Paternal Grandfather    Breast cancer Neg Hx     Social History Social History   Tobacco Use   Smoking status: Every Day    Current packs/day: 0.50    Average packs/day: 0.5 packs/day for 10.0 years (5.0 ttl pk-yrs)    Types: Cigarettes, Cigars   Smokeless tobacco: Never   Tobacco comments:    1 black and mild per day- wanting to quit. Counseling provided.   Vaping Use   Vaping status: Never Used  Substance Use Topics   Alcohol use: Yes    Comment: social    Drug use: Never     Allergies   Patient has no known allergies.   Review of Systems Review of Systems  Constitutional:  Negative for chills, fever and unexpected weight change.  Respiratory:  Negative for  cough and shortness of breath.   Cardiovascular:  Negative for chest pain and palpitations.  Skin:  Negative for color change, rash and wound.     Physical Exam Triage Vital Signs ED Triage Vitals [02/20/24 1356]  Encounter Vitals Group     BP      Girls Systolic BP Percentile      Girls Diastolic BP Percentile      Boys Systolic BP Percentile      Boys Diastolic BP Percentile      Pulse      Resp      Temp      Temp src      SpO2      Weight      Height      Head Circumference      Peak Flow      Pain Score 7     Pain Loc      Pain Education      Exclude from Growth Chart    No data found.  Updated Vital Signs BP 124/68   Pulse 78   Temp 98.2 F (36.8 C)   Resp 18   LMP 02/03/2024   SpO2 98%   Visual Acuity Right Eye Distance:   Left Eye Distance:   Bilateral Distance:    Right Eye Near:   Left Eye Near:    Bilateral Near:     Physical Exam Constitutional:      General: She is not in acute distress. HENT:     Mouth/Throat:     Mouth: Mucous membranes are moist.  Cardiovascular:     Rate and Rhythm: Normal rate and regular rhythm.     Heart sounds: Normal heart sounds.  Pulmonary:     Effort: Pulmonary effort is normal. No respiratory distress.     Breath sounds: Normal breath sounds.  Chest:       Comments: Left breast tenderness to palpation.  No palpable mass noted.  No nipple discharge or skin changes.  No erythema or wounds.  Breast grossly symmetrical on exam. Skin:    Findings: No bruising, erythema, lesion or rash.  Neurological:     Mental Status: She is alert.      UC Treatments / Results  Labs (all labs ordered are listed, but only abnormal results are displayed) Labs Reviewed - No data to display  EKG   Radiology No results found.  Procedures Procedures (including critical care time)  Medications Ordered in UC Medications - No data to display  Initial Impression / Assessment and Plan / UC Course  I have reviewed  the triage vital signs and the nursing notes.  Pertinent labs & imaging results that were available during my care of the patient were reviewed by me and considered in my medical decision making (see chart for details).    Left breast pain.  At this time, no palpable masses noted.  No skin changes or nipple discharge.  No indication of infection.  Ultrasound and mammogram ordered today; scheduled for 02/22/2024.  Instructed patient to follow-up with her PCP.  She agrees to plan of care.  Final Clinical Impressions(s) / UC Diagnoses   Final diagnoses:  Breast pain, left     Discharge Instructions      Go to [facility] for the mammogram and/or ultrasound of your breast on 02/22/2024 at 11:00 AM.    Schedule a follow-up appointment with your primary care provider.     ED Prescriptions   None    PDMP not reviewed this encounter.   Corlis Burnard DEL, NP 02/20/24 [phone]

## 2024-02-21 ENCOUNTER — Encounter: Payer: Self-pay | Admitting: Nurse Practitioner

## 2024-02-21 NOTE — Telephone Encounter (Unsigned)
 Copied from CRM [id number]. Topic: Clinical - Request for Lab/Test Order >> Feb 21, 2024  4:24 PM Burnard DEL wrote: Reason for CRM: Patient called in stating that she is schedule for a mammogram and US  on tomorrow,however she would like to know if she could have a MRI instead.Patient stated that she has already had both earlier this year and haven't gotten any results or solutions and she is still having the same pain. She would like to take a different avenue to maybe get better answers.

## 2024-02-22 ENCOUNTER — Ambulatory Visit: Payer: Self-pay | Admitting: Nurse Practitioner

## 2024-02-22 ENCOUNTER — Ambulatory Visit
Admission: RE | Admit: 2024-02-22 | Discharge: 2024-02-22 | Disposition: A | Discharge status: Home / Self Care | Source: Ambulatory Visit | Attending: Emergency Medicine | Admitting: Emergency Medicine

## 2024-02-22 ENCOUNTER — Inpatient Hospital Stay: Admit: 2024-02-22 | Discharge: 2024-02-22 | Attending: Emergency Medicine

## 2024-02-22 ENCOUNTER — Ambulatory Visit (HOSPITAL_COMMUNITY): Payer: Self-pay

## 2024-02-22 DIAGNOSIS — N644 Mastodynia: Secondary | ICD-10-CM | POA: Diagnosis present

## 2024-02-28 ENCOUNTER — Other Ambulatory Visit: Payer: Self-pay | Admitting: Nurse Practitioner

## 2024-02-28 DIAGNOSIS — N644 Mastodynia: Secondary | ICD-10-CM

## 2024-02-28 DIAGNOSIS — R92343 Mammographic extreme density, bilateral breasts: Secondary | ICD-10-CM

## 2024-03-01 ENCOUNTER — Emergency Department
Admission: EM | Admit: 2024-03-01 | Discharge: 2024-03-01 | Disposition: A | Discharge status: Home / Self Care | Attending: Emergency Medicine | Admitting: Emergency Medicine

## 2024-03-01 ENCOUNTER — Emergency Department

## 2024-03-01 ENCOUNTER — Other Ambulatory Visit: Payer: Self-pay

## 2024-03-01 DIAGNOSIS — S8012XA Contusion of left lower leg, initial encounter: Secondary | ICD-10-CM | POA: Diagnosis not present

## 2024-03-01 DIAGNOSIS — S8992XA Unspecified injury of left lower leg, initial encounter: Secondary | ICD-10-CM | POA: Diagnosis present

## 2024-03-01 DIAGNOSIS — Y92410 Unspecified street and highway as the place of occurrence of the external cause: Secondary | ICD-10-CM | POA: Diagnosis not present

## 2024-03-01 DIAGNOSIS — S6991XA Unspecified injury of right wrist, hand and finger(s), initial encounter: Secondary | ICD-10-CM | POA: Diagnosis not present

## 2024-03-01 MED ORDER — ACETAMINOPHEN 325 MG PO TABS
650.0000 mg | ORAL_TABLET | Freq: Once | ORAL | Status: AC
  Administered 2024-03-01: 650 mg via ORAL
  Filled 2024-03-01: qty 2
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
## 2024-03-01 NOTE — Discharge Instructions (Addendum)
 Your x-ray does not reveal any broken bones.  Please take Tylenol /ibuprofen per package instructions to help with your symptoms.  Please return for any new, worsening, or changing symptoms or other concerns.  It was a pleasure caring for you today.
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
## 2024-03-26 ENCOUNTER — Ambulatory Visit (HOSPITAL_COMMUNITY)
Admission: RE | Admit: 2024-03-26 | Discharge: 2024-03-26 | Disposition: A | Source: Ambulatory Visit | Attending: Nurse Practitioner

## 2024-03-26 DIAGNOSIS — R92343 Mammographic extreme density, bilateral breasts: Secondary | ICD-10-CM | POA: Diagnosis present

## 2024-03-26 DIAGNOSIS — N644 Mastodynia: Secondary | ICD-10-CM | POA: Diagnosis present

## 2024-03-26 MED ORDER — GADOBUTROL 1 MMOL/ML IV SOLN
5.0000 mL | Freq: Once | INTRAVENOUS | Status: AC | PRN
  Administered 2024-03-26: 5 mL via INTRAVENOUS

## 2024-03-27 ENCOUNTER — Ambulatory Visit: Payer: Self-pay | Admitting: Nurse Practitioner

## 2024-04-03 ENCOUNTER — Ambulatory Visit: Admitting: Nurse Practitioner

## 2024-04-03 ENCOUNTER — Encounter: Payer: Self-pay | Admitting: Nurse Practitioner

## 2024-04-03 VITALS — BP 110/74 | HR 82 | Temp 98.9°F | Ht 68.0 in | Wt 123.0 lb

## 2024-04-03 DIAGNOSIS — F419 Anxiety disorder, unspecified: Secondary | ICD-10-CM

## 2024-04-03 MED ORDER — SERTRALINE HCL 25 MG PO TABS: 25.0000 mg | ORAL_TABLET | Freq: Every day | ORAL | 11 refills | Status: AC

## 2024-04-03 NOTE — Progress Notes (Unsigned)
 Leron Glance, NP-C Phone: [phone]  Vinette Crites is a 33 y.o. female who presents today for follow up.   Discussed the use of AI scribe software for clinical note transcription with the patient, who gave verbal consent to proceed.  History of Present Illness   Terriah Onstad De'Rika is a 33 year old female who presents for a follow-up on her mood and medication management.  Her mood has been stable since the last visit. Approximately six weeks ago, her Zoloft  dosage was increased from 25 mg to 50 mg. She found the 50 mg dose excessive and returned to the 25 mg dose, which she finds effective as long as she takes it consistently. She has been more diligent about taking her medication consistently since trying the higher dose.  Her menstrual periods have been regular and manageable since starting birth control, and she confirms that she is still taking her birth control medication. She also continues to take iron  supplements and reports regular bowel movements, with only one or two instances of mild constipation that resolved quickly.  She has a history of iron  deficiency anemia and is scheduled for a gastrointestinal appointment at the end of the month to investigate potential causes, including gastrointestinal issues or heavy menstrual periods. She also has a history of irritable bowel syndrome and hemorrhoids, which are part of the reason for the gastrointestinal consultation.  She underwent a breast MRI due to chronic breast pain, which returned normal results.      Social History   Tobacco Use  Smoking Status Every Day   Current packs/day: 0.50   Average packs/day: 0.5 packs/day for 10.0 years (5.0 ttl pk-yrs)   Types: Cigarettes, Cigars  Smokeless Tobacco Never  Tobacco Comments   1 black and mild per day- wanting to quit. Counseling provided.     Current Outpatient Medications on File Prior to Visit  Medication Sig Dispense Refill   hydrocortisone  (ANUSOL -HC) 2.5 % rectal  cream Place 1 Application rectally 3 (three) times daily as needed for hemorrhoids or anal itching. 30 g 2   Iron , Ferrous Sulfate , 325 (65 Fe) MG TABS Take 1 tablet by mouth daily. 90 tablet 1   MILI 0.25-35 MG-MCG tablet Take 1 tablet by mouth daily.     Vitamin D , Ergocalciferol , (DRISDOL ) 1.25 MG (50000 UNIT) CAPS capsule Take 1 capsule (50,000 Units total) by mouth every 7 (seven) days. 13 capsule 1   No current facility-administered medications on file prior to visit.     ROS see history of present illness  Objective  Physical Exam Vitals:   04/03/24 1559  BP: 110/74  Pulse: 82  Temp: 98.9 F (37.2 C)  SpO2: 99%    BP Readings from Last 3 Encounters:  04/03/24 110/74  03/01/24 136/75  02/20/24 124/68   Wt Readings from Last 3 Encounters:  04/03/24 123 lb (55.8 kg)  03/01/24 122 lb 9.2 oz (55.6 kg)  02/08/24 122 lb 9.6 oz (55.6 kg)    Physical Exam Constitutional:      General: She is not in acute distress.    Appearance: Normal appearance.  HENT:     Head: Normocephalic.  Cardiovascular:     Rate and Rhythm: Normal rate and regular rhythm.     Heart sounds: Normal heart sounds.  Pulmonary:     Effort: Pulmonary effort is normal.     Breath sounds: Normal breath sounds.  Skin:    General: Skin is warm and dry.  Neurological:     General:  No focal deficit present.     Mental Status: She is alert.  Psychiatric:        Mood and Affect: Mood normal.        Behavior: Behavior normal.      Assessment/Plan: Please see individual problem list.  Anxiety and depression Assessment & Plan: Her condition is well-managed on sertraline  25 mg daily. A previous increase to 50 mg caused adverse effects. Consistent medication adherence has improved her mood. Adjust sertraline  prescription to 25 mg tablets. She should monitor her mood and report if symptoms worsen or if a higher dose is needed. We will continue to monitor.   Orders: -     Sertraline  HCl; Take 1  tablet (25 mg total) by mouth at bedtime.  Dispense: 30 tablet; Refill: 11  Iron  deficiency anemia, unspecified iron  deficiency anemia type Assessment & Plan: This is likely secondary to heavy menstrual bleeding. She is scheduled for a GI evaluation to rule out other causes of bleeding. Continue current iron  supplementation and proceed with the scheduled GI evaluation to assess for potential GI causes of bleeding.   Irritable bowel syndrome with constipation Assessment & Plan: Her condition is well-controlled with no recent episodes of constipation. Occasional mild constipation resolves spontaneously. Continue current management and monitor symptoms.   Chronic pain of breast Assessment & Plan: Breast MRI normal. Continue to monitor and report if worsening or changing.       Return in about 33 weeks (around 11/20/2024) for Annual Exam, sooner as needed.   Leron Glance, NP-C [facility]

## 2024-04-10 ENCOUNTER — Encounter: Payer: Self-pay | Admitting: Nurse Practitioner

## 2024-04-10 NOTE — Assessment & Plan Note (Signed)
 This is likely secondary to heavy menstrual bleeding. She is scheduled for a GI evaluation to rule out other causes of bleeding. Continue current iron  supplementation and proceed with the scheduled GI evaluation to assess for potential GI causes of bleeding.

## 2024-04-10 NOTE — Assessment & Plan Note (Signed)
 Her condition is well-managed on sertraline  25 mg daily. A previous increase to 50 mg caused adverse effects. Consistent medication adherence has improved her mood. Adjust sertraline  prescription to 25 mg tablets. She should monitor her mood and report if symptoms worsen or if a higher dose is needed. We will continue to monitor.

## 2024-04-10 NOTE — Assessment & Plan Note (Signed)
 Her condition is well-controlled with no recent episodes of constipation. Occasional mild constipation resolves spontaneously. Continue current management and monitor symptoms.

## 2024-04-10 NOTE — Assessment & Plan Note (Signed)
 Breast MRI normal. Continue to monitor and report if worsening or changing.

## 2024-04-30 ENCOUNTER — Other Ambulatory Visit: Payer: Self-pay

## 2024-04-30 DIAGNOSIS — G8911 Acute pain due to trauma: Secondary | ICD-10-CM

## 2024-05-14 ENCOUNTER — Telehealth: Payer: Self-pay | Admitting: Podiatry

## 2024-05-14 NOTE — Telephone Encounter (Signed)
 [facility] in [facility] called patient left message to call our office to schedule to pick them up.

## 2024-05-16 ENCOUNTER — Ambulatory Visit: Admission: RE | Admit: 2024-05-16 | Discharge: 2024-05-16 | Disposition: A | Source: Ambulatory Visit

## 2024-05-16 DIAGNOSIS — G8911 Acute pain due to trauma: Secondary | ICD-10-CM

## 2024-05-24 ENCOUNTER — Telehealth: Payer: Self-pay | Admitting: Podiatry

## 2024-05-24 NOTE — Telephone Encounter (Signed)
 [facility] in [facility], called LM on VM for pt to call to schedule appt to [facility] there is a balance 81.30

## 2024-11-20 ENCOUNTER — Encounter: Admitting: Nurse Practitioner

## 2024-11-21 ENCOUNTER — Encounter: Admitting: Nurse Practitioner
